# Patient Record
Sex: Male | Born: 1975 | Race: Black or African American | Hispanic: No | Marital: Single | Smoking: Current every day smoker
Health system: Southern US, Community
[De-identification: ages and names within clinical notes are randomized; demographics above are authoritative.]

## PROBLEM LIST (undated history)

## (undated) DIAGNOSIS — E785 Hyperlipidemia, unspecified: Secondary | ICD-10-CM

## (undated) DIAGNOSIS — F17200 Nicotine dependence, unspecified, uncomplicated: Secondary | ICD-10-CM

## (undated) DIAGNOSIS — E559 Vitamin D deficiency, unspecified: Secondary | ICD-10-CM

## (undated) DIAGNOSIS — T7840XA Allergy, unspecified, initial encounter: Secondary | ICD-10-CM

## (undated) DIAGNOSIS — G4730 Sleep apnea, unspecified: Secondary | ICD-10-CM

## (undated) DIAGNOSIS — G4733 Obstructive sleep apnea (adult) (pediatric): Secondary | ICD-10-CM

## (undated) DIAGNOSIS — J302 Other seasonal allergic rhinitis: Secondary | ICD-10-CM

## (undated) DIAGNOSIS — K219 Gastro-esophageal reflux disease without esophagitis: Secondary | ICD-10-CM

## (undated) HISTORY — DX: Nicotine dependence, unspecified, uncomplicated: F17.200

## (undated) HISTORY — DX: Obstructive sleep apnea (adult) (pediatric): G47.33

## (undated) HISTORY — DX: Vitamin D deficiency, unspecified: E55.9

## (undated) HISTORY — DX: Sleep apnea, unspecified: G47.30

## (undated) HISTORY — DX: Allergy, unspecified, initial encounter: T78.40XA

## (undated) HISTORY — DX: Hyperlipidemia, unspecified: E78.5

## (undated) HISTORY — DX: Other seasonal allergic rhinitis: J30.2

## (undated) HISTORY — DX: Gastro-esophageal reflux disease without esophagitis: K21.9

---

## 1997-08-18 ENCOUNTER — Encounter: Admission: RE | Admit: 1997-08-18 | Discharge: 1997-08-18 | Payer: Self-pay | Admitting: *Deleted

## 2007-07-19 ENCOUNTER — Encounter: Admission: RE | Admit: 2007-07-19 | Discharge: 2007-07-19 | Payer: Self-pay | Admitting: Internal Medicine

## 2011-07-14 ENCOUNTER — Encounter: Payer: Self-pay | Admitting: Gastroenterology

## 2011-07-30 ENCOUNTER — Encounter: Payer: Self-pay | Admitting: Gastroenterology

## 2011-07-30 ENCOUNTER — Ambulatory Visit (INDEPENDENT_AMBULATORY_CARE_PROVIDER_SITE_OTHER): Payer: BC Managed Care – PPO | Admitting: Gastroenterology

## 2011-07-30 VITALS — BP 110/70 | HR 72 | Ht 68.0 in | Wt 235.0 lb

## 2011-07-30 DIAGNOSIS — R198 Other specified symptoms and signs involving the digestive system and abdomen: Secondary | ICD-10-CM

## 2011-07-30 DIAGNOSIS — R12 Heartburn: Secondary | ICD-10-CM

## 2011-07-30 DIAGNOSIS — K219 Gastro-esophageal reflux disease without esophagitis: Secondary | ICD-10-CM

## 2011-07-30 DIAGNOSIS — R1310 Dysphagia, unspecified: Secondary | ICD-10-CM

## 2011-07-30 DIAGNOSIS — R194 Change in bowel habit: Secondary | ICD-10-CM

## 2011-07-30 MED ORDER — MOVIPREP 100 G PO SOLR: 1.0000 | ORAL | Status: DC

## 2011-07-30 NOTE — Progress Notes (Signed)
HPI: This is a   very pleasant 36 year old man whom I am meeting for the first time today.  Difficulty swallowing intermittently.  Drinking liquids can have feeling of going down wrong tube, odynophagia.  This occurs 2-3 times a month.  Also has intermittent "intense hunger pains." periumbilically.  Lasts 30 min. +pyrosis occasionally with tomatos, apples, certain fried foods.  Had low pelvic pain, noticed change in bowels (harder to get out, a lot strain a lot).  No overt GI bleeding.  Currently on ppi, for 2 years, takes prn about once a week. No other antiacids.  Nonsmoker, rare etoh, rare caffeine.    Overall his weight is pretty stable.     Review of systems: Pertinent positive and negative review of systems were noted in the above HPI section. Complete review of systems was performed and was otherwise normal.    Past Medical History  Diagnosis Date  . GERD (gastroesophageal reflux disease)     History reviewed. No pertinent past surgical history.  Current Outpatient Prescriptions  Medication Sig Dispense Refill  . omeprazole (PRILOSEC) 40 MG capsule Take 40 mg by mouth as needed.        Allergies as of 07/30/2011  . (No Known Allergies)    Family History  Problem Relation Age of Onset  . Colon cancer Neg Hx   . Colon polyps Father     History   Social History  . Marital Status: Single    Spouse Name: N/A    Number of Children: N/A  . Years of Education: N/A   Occupational History  . Not on file.   Social History Main Topics  . Smoking status: Current Everyday Smoker  . Smokeless tobacco: Not on file   Comment: form given 07-30-11  . Alcohol Use: Yes     occasional  . Drug Use: No  . Sexually Active: Not on file   Other Topics Concern  . Not on file   Social History Narrative  . No narrative on file       Physical Exam: BP 110/70  Pulse 72  Ht 5\' 8"  (1.727 m)  Wt 235 lb (106.595 kg)  BMI 35.73 kg/m2 Constitutional: generally  well-appearing Psychiatric: alert and oriented x3 Eyes: extraocular movements intact Mouth: oral pharynx moist, no lesions Neck: supple no lymphadenopathy Cardiovascular: heart regular rate and rhythm Lungs: clear to auscultation bilaterally Abdomen: soft, nontender, nondistended, no obvious ascites, no peritoneal signs, normal bowel sounds Extremities: no lower extremity edema bilaterally Skin: no lesions on visible extremities    Assessment and plan: 36 y.o. male with  GERD-like dyspepsia, recent change in bowel habits, intermittent mild dysphasia.  I recommended he change the way he is taking his proton pump inhibitor so that he takes it every day instead of once a week or so. He will also try fiber supplements for his recent change in bowel habits. I think we should proceed with EGD as well as colonoscopy at his soonest convenience to check for infections, gastritis, peptic ulcer disease, significant colon pathology that could have led to his change in bowel habits.

## 2011-07-30 NOTE — Patient Instructions (Addendum)
One of your biggest health concerns is your smoking.  This increases your risk for most cancers and serious cardiovascular diseases such as strokes, heart attacks.  You should try your best to stop.  If you need assistance, please contact your PCP or Smoking Cessation Class at [facility] [phone]) or [facility]uit[facility] (1-800-QUIT-NOW). You will be set up for a colonoscopy for change in bowel habits. You will be set up for an upper endoscopy for GERD, pyrosis, dysphagia. You should change the way you are taking your antiacid medicine (omeprazole) so that you are taking it 20-30 minutes prior to a decent meal as that is the way the pill is designed to work most effectively. Please start taking citrucel (orange flavored) powder fiber supplement.  This may cause some bloating at first but that usually goes away. Begin with a small spoonful and work your way up to a large, heaping spoonful daily over a week.

## 2011-08-15 ENCOUNTER — Ambulatory Visit (AMBULATORY_SURGERY_CENTER): Payer: BC Managed Care – PPO | Admitting: Gastroenterology

## 2011-08-15 ENCOUNTER — Encounter: Payer: Self-pay | Admitting: Gastroenterology

## 2011-08-15 VITALS — BP 131/77 | HR 76 | Temp 98.5°F | Resp 20 | Ht 68.0 in | Wt 235.0 lb

## 2011-08-15 DIAGNOSIS — R198 Other specified symptoms and signs involving the digestive system and abdomen: Secondary | ICD-10-CM

## 2011-08-15 DIAGNOSIS — K219 Gastro-esophageal reflux disease without esophagitis: Secondary | ICD-10-CM

## 2011-08-15 DIAGNOSIS — R1310 Dysphagia, unspecified: Secondary | ICD-10-CM

## 2011-08-15 DIAGNOSIS — R194 Change in bowel habit: Secondary | ICD-10-CM

## 2011-08-15 DIAGNOSIS — K2970 Gastritis, unspecified, without bleeding: Secondary | ICD-10-CM

## 2011-08-15 DIAGNOSIS — D131 Benign neoplasm of stomach: Secondary | ICD-10-CM

## 2011-08-15 DIAGNOSIS — R12 Heartburn: Secondary | ICD-10-CM

## 2011-08-15 MED ORDER — SODIUM CHLORIDE 0.9 % IV SOLN: 500.0000 mL | INTRAVENOUS | Status: DC

## 2011-08-15 NOTE — Patient Instructions (Addendum)
One of your biggest health concerns is your smoking.  This increases your risk for most cancers and serious cardiovascular diseases such as strokes, heart attacks.  You should try your best to stop.  If you need assistance, please contact your PCP or Smoking Cessation Class at [facility] [phone]) or [place] (1-800-QUIT-NOW).   YOU HAD AN ENDOSCOPIC PROCEDURE TODAY AT [facility]: Refer to the procedure report that was given to you for any specific questions about what was found during the examination.  If the procedure report does not answer your questions, please call your gastroenterologist to clarify.  If you requested that your care partner not be given the details of your procedure findings, then the procedure report has been included in a sealed envelope for you to review at your convenience later.  YOU SHOULD EXPECT: Some feelings of bloating in the abdomen. Passage of more gas than usual.  Walking can help get rid of the air that was put into your GI tract during the procedure and reduce the bloating. If you had a lower endoscopy (such as a colonoscopy or flexible sigmoidoscopy) you may notice spotting of blood in your stool or on the toilet paper. If you underwent a bowel prep for your procedure, then you may not have a normal bowel movement for a few days.  DIET: Your first meal following the procedure should be a light meal and then it is ok to progress to your normal diet.  A half-sandwich or bowl of soup is an example of a good first meal.  Heavy or fried foods are harder to digest and may make you feel nauseous or bloated.  Likewise meals heavy in dairy and vegetables can cause extra gas to form and this can also increase the bloating.  Drink plenty of fluids but you should avoid alcoholic beverages for 24 hours.  ACTIVITY: Your care partner should take you home directly after the procedure.  You should plan to take it easy, moving slowly for the rest of  the day.  You can resume normal activity the day after the procedure however you should NOT DRIVE or use heavy machinery for 24 hours (because of the sedation medicines used during the test).    SYMPTOMS TO REPORT IMMEDIATELY: A gastroenterologist can be reached at any hour.  During normal business hours, 8:30 AM to 5:00 PM Monday through Friday, call [phone].  After hours and on weekends, please call the GI answering service at [phone] who will take a message and have the physician on call contact you.   Following lower endoscopy (colonoscopy or flexible sigmoidoscopy):  Excessive amounts of blood in the stool  Significant tenderness or worsening of abdominal pains  Swelling of the abdomen that is new, acute  Fever of 100F or higher  Following upper endoscopy (EGD)  Vomiting of blood or coffee ground material  New chest pain or pain under the shoulder blades  Painful or persistently difficult swallowing  New shortness of breath  Fever of 100F or higher  Black, tarry-looking stools  FOLLOW UP: If any biopsies were taken you will be contacted by phone or by letter within the next 1-3 weeks.  Call your gastroenterologist if you have not heard about the biopsies in 3 weeks.  Our staff will call the home number listed on your records the next business day following your procedure to check on you and address any questions or concerns that you may have at that time regarding  the information given to you following your procedure. This is a courtesy call and so if there is no answer at the home number and we have not heard from you through the emergency physician on call, we will assume that you have returned to your regular daily activities without incident.  SIGNATURES/CONFIDENTIALITY: You and/or your care partner have signed paperwork which will be entered into your electronic medical record.  These signatures attest to the fact that that the information above on your After Visit  Summary has been reviewed and is understood.  Full responsibility of the confidentiality of this discharge information lies with you and/or your care-partner.   Take your acid medicine 20-30 minutes prior to breakfast  Start once daily fiber supplements- as recommended in the office per Dr. Christella Hartigan

## 2011-08-15 NOTE — Op Note (Signed)
[facility] [address] [place], [place]  [address]  ENDOSCOPY PROCEDURE REPORT  PATIENT:  Bruce Brooks, Bruce Brooks  MR#:  [id number] BIRTHDATE:  Aug 13, 1975, 36 yrs. old  GENDER:  male ENDOSCOPIST:  Rachael Fee, MD PROCEDURE DATE:  08/15/2011 PROCEDURE:  EGD with biopsy, 19147 ASA CLASS:  Class II INDICATIONS:  GERD, dyspepsia MEDICATIONS:  There was residual sedation effect present from prior procedure. TOPICAL ANESTHETIC:  none  DESCRIPTION OF PROCEDURE:   After the risks benefits and alternatives of the procedure were thoroughly explained, informed consent was obtained.  The LB GIF-H180 [id number] endoscope was introduced through the mouth and advanced to the second portion of the duodenum, without limitations.  The instrument was slowly withdrawn as the mucosa was fully examined. <<PROCEDUREIMAGES>> There was mild, non-specific gastritis. Biopsies taken and sent to pathology (jar 1) (see image1 and image2).    Retroflexed views revealed no abnormalities.    The scope was then withdrawn from the patient and the procedure completed. COMPLICATIONS:  None  ENDOSCOPIC IMPRESSION: 1) Mild gastritis, biopsied to check for H. pylori  RECOMMENDATIONS: Change your anti acid medicine so that you are taking it 20-30 min prior to breakfast meal (as discussed in office).  ______________________________ Rachael Fee, MD  cc: Kellie Shropshire, MD  n. eSIGNED:   Rachael Fee at 08/15/2011 03:58 PM  Ashok Norris, [phone]

## 2011-08-15 NOTE — Op Note (Signed)
[facility] [address] [place], [place]  [address]  COLONOSCOPY PROCEDURE REPORT  PATIENT:  Bruce, Brooks  MR#:  [id number] BIRTHDATE:  June 29, 1975, 36 yrs. old  GENDER:  male ENDOSCOPIST:  Rachael Fee, MD REF. BY:  Kellie Shropshire, M.D. PROCEDURE DATE:  08/15/2011 PROCEDURE:  Colonoscopy 19147 ASA CLASS:  Class II INDICATIONS:  change in bowel habits MEDICATIONS:   Fentanyl 175 mcg IV, These medications were titrated to patient response per physician's verbal order, Versed 12 mg IV  DESCRIPTION OF PROCEDURE:   After the risks benefits and alternatives of the procedure were thoroughly explained, informed consent was obtained.  Digital rectal exam was performed and revealed no rectal masses.   The LB [id number]-H180AL [id number] endoscope was introduced through the anus and advanced to the cecum, which was identified by both the appendix and ileocecal valve, without limitations.  The quality of the prep was good..  The instrument was then slowly withdrawn as the colon was fully examined. <<PROCEDUREIMAGES>> FINDINGS:  A normal appearing cecum, ileocecal valve, and appendiceal orifice were identified. The ascending, hepatic flexure, transverse, splenic flexure, descending, sigmoid colon, and rectum appeared unremarkable (see image1, image2, and image3). Retroflexed views in the rectum revealed no abnormalities. COMPLICATIONS:  None  ENDOSCOPIC IMPRESSION: 1) Normal colon 2) No polyps or cancers  RECOMMENDATIONS: Start once daily fiber supplements, as recommended in the office.  ______________________________ Rachael Fee, MD  n. eSIGNED:   Rachael Fee at 08/15/2011 03:49 PM  Ashok Norris, [phone]

## 2011-08-15 NOTE — Progress Notes (Signed)
Patient did not experience any of the following events: a burn prior to discharge; a fall within the facility; wrong site/side/patient/procedure/implant event; or a hospital transfer or hospital admission upon discharge from the facility. (G8907) Patient did not have preoperative order for IV antibiotic SSI prophylaxis. (G8918)  

## 2011-08-18 ENCOUNTER — Telehealth: Payer: Self-pay

## 2011-08-18 NOTE — Telephone Encounter (Signed)
Left message on answering machine. 

## 2011-08-20 ENCOUNTER — Encounter: Payer: Self-pay | Admitting: Gastroenterology

## 2015-08-28 ENCOUNTER — Institutional Professional Consult (permissible substitution): Payer: BC Managed Care – PPO | Admitting: Neurology

## 2016-12-28 ENCOUNTER — Encounter: Payer: Self-pay | Admitting: Neurology

## 2016-12-29 ENCOUNTER — Encounter: Payer: Self-pay | Admitting: Neurology

## 2016-12-29 ENCOUNTER — Ambulatory Visit (INDEPENDENT_AMBULATORY_CARE_PROVIDER_SITE_OTHER): Payer: BC Managed Care – PPO | Admitting: Neurology

## 2016-12-29 ENCOUNTER — Encounter (INDEPENDENT_AMBULATORY_CARE_PROVIDER_SITE_OTHER): Payer: Self-pay

## 2016-12-29 VITALS — BP 120/76 | HR 58 | Ht 68.0 in | Wt 242.0 lb

## 2016-12-29 DIAGNOSIS — R351 Nocturia: Secondary | ICD-10-CM | POA: Diagnosis not present

## 2016-12-29 DIAGNOSIS — F17200 Nicotine dependence, unspecified, uncomplicated: Secondary | ICD-10-CM

## 2016-12-29 DIAGNOSIS — R0683 Snoring: Secondary | ICD-10-CM

## 2016-12-29 DIAGNOSIS — E669 Obesity, unspecified: Secondary | ICD-10-CM | POA: Diagnosis not present

## 2016-12-29 DIAGNOSIS — G4719 Other hypersomnia: Secondary | ICD-10-CM | POA: Diagnosis not present

## 2016-12-29 NOTE — Progress Notes (Signed)
SLEEP MEDICINE CLINIC   Provider:  Larey Seat, M D  Primary Care Physician:  Glendale Chard, MD   Referring Provider: [name], FNP    Chief Complaint  Patient presents with  . New Patient (Initial Visit)    pt alone, rm 10. pt  is waking up several times at night. pt has been told he snores and stops breathing in sleep.     HPI:  Bruce Brooks is a 41 y.o. male , seen here as in a referral  from Dr. Baird Cancer and NP [name] for a sleep apnea evaluation, after being told he snores and stops to breath at night in his sleep. Chief complaint according to patient : hypersomnia/ fatigue.  Bruce Brooks is a 41 year old African-American right-handed male patient, presenting with a feeling of increased sleepiness and fatigue in daytime, which  has slowly evolved over several years. He has changed his lifestyle in several ways, has adjusted his diet and has implemented a more active lifestyle. He is in the process of reducing his tobacco use and endorsed now 6 cigarettes a day on average. He very rarely drinks alcohol, he drinks coffee during daytime mostly in the morning about 16 ounces daily.  Sleep habits are as follows: He goes to bed between 10-11 PM, after a late dinner at 9.30 PM. The patient usually sleeps on his side, or falls asleep on her side with 2 pillows for head support. He shares the bedroom with a male partner. He describes his bedroom as cool, quiet and dark. He has been told that he snores but he has never woken himself up from snoring. He has 2-3 time nocturia( 2.00- 4.00- finally at 6.30 AM).  Rises between 6.30 and 7 AM. He uses an alarm for backup, but is usually spontaneously awake at the time he needs to rise. He does not feel as refreshed and restored as he used to. He denies having a dry mouth, palpitations, nausea, dizziness or headaches/ pain. He endorsed joint stiffness. He estimates ( through fit bit ) 4-6 hours of sleep each night.   Sleep  medical history and family sleep history: mother has OSA, sister has been snoring.   Social history: He works 2 jobs , between 8 Am and 9.00 PM. No night shifts. No children, not married. College graduate . He has changed his lifestyle in several ways, has adjusted his diet and has implemented a more active lifestyle. He is in the process of reducing his tobacco use and endorsed now 6 cigarettes a day on average. He very rarely drinks alcohol, he drinks coffee during daytime mostly in the morning about 16 ounces daily.   Review of Systems: Out of a complete 14 system review, the patient complains of only the following symptoms, and all other reviewed systems are negative. Snoring , fatigue, Sleepiness, occasionally sleep talking.  Epworth score  13/ 24  , Fatigue severity score 10  , depression score n/a    Social History   Social History  . Marital status: Single    Spouse name: N/A  . Number of children: N/A  . Years of education: N/A   Occupational History  . Not on file.   Social History Main Topics  . Smoking status: Current Every Day Smoker    Packs/day: 0.50  . Smokeless tobacco: Never Used     Comment: form given 07-30-11  . Alcohol use Yes     Comment: occasional  . Drug use: No  .  Sexual activity: Not on file   Other Topics Concern  . Not on file   Social History Narrative  . No narrative on file    Family History  Problem Relation Age of Onset  . Colon cancer Neg Hx   . Colon polyps Father     Past Medical History:  Diagnosis Date  . Allergy    seasonal  . GERD (gastroesophageal reflux disease)   . Hyperlipidemia   . Tobacco dependence   . Vitamin D deficiency     No past surgical history on file.  Current Outpatient Prescriptions  Medication Sig Dispense Refill  . ergocalciferol (VITAMIN D2) 50000 units capsule Take 50,000 Units by mouth once a week.     No current facility-administered medications for this visit.     Allergies as of  12/29/2016  . (No Known Allergies)    Vitals: BP 120/76   Pulse (!) 58   Ht 5\' 8"  (1.727 m)   Wt 242 lb (109.8 kg)   BMI 36.80 kg/m  Last Weight:  Wt Readings from Last 1 Encounters:  12/29/16 242 lb (109.8 kg)   KTG:YBWL mass index is 36.8 kg/m.     Last Height:   Ht Readings from Last 1 Encounters:  12/29/16 5\' 8"  (1.727 m)    Physical exam:  General: The patient is awake, alert and appears not in acute distress. The patient is well groomed. Head: Normocephalic, atraumatic. Neck is supple. Mallampati 5- uvula is invisible,,  neck circumference: 19.25 inches. Nasal airflow patent,  Retrognathia is seen.  Cardiovascular:  Regular rate and rhythm , without  murmurs or carotid bruit, and without distended neck veins. Respiratory: Lungs are clear to auscultation. Skin:  Without evidence of edema, or rash Trunk: BMI is over 35-. The patient's posture is erect , muscular build.  Neurologic exam : The patient is awake and alert, oriented to place and time.   Memory subjective  described as intact. Attention span & concentration ability appears normal.  Speech is fluent, without dysarthria, dysphonia or aphasia.  Mood and affect are appropriate.  Cranial nerves: Pupils are equal and briskly reactive to light. Extraocular movements  in vertical and horizontal planes intact and without nystagmus. Visual fields by finger perimetry are intact.Hearing to finger rub intact.  Facial sensation intact to fine touch. Facial motor strength is symmetric and tongue and uvula move midline. Shoulder shrug was symmetrical.  Motor exam:  Normal tone, muscle bulk and symmetric strength in all extremities. Sensory:  Fine touch, pinprick and vibration were tested in all extremities. Proprioception tested in the upper extremities was normal. Coordination: Rapid alternating movements in the fingers/hands was normal. Finger-to-nose maneuver  normal without evidence of ataxia, dysmetria or tremor. Gait and  station: Patient walks without assistive device -Turns with  3 Steps. Romberg testing is negative.  Deep tendon reflexes: in the  upper and lower extremities are symmetric and intact. Babinski maneuver response is downgoing.   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 Overall his risk factors are indirectly related to his weight, such as the above average neck circumference, but reported snoring, he  also has a high-grade Mallampati and mild retrognathia. His risk factors are also elevated because of African-American race and male gender. Hi hypersomnia and daytime fatigue are most likely due to mild sleep apnea.   The patient was advised of the nature of the diagnosed disorder , the treatment options and the  risks for general health and wellness arising from not treating the condition.   I spent more than 50 minutes of face to face time with the patient.  Greater than 50% of time was spent in counseling and coordination of care. We have discussed the diagnosis and differential and I answered the patient's questions.    Plan:  Treatment plan and additional workup : SPLIT night PSG , attended sleep study   Rv with me .  Larey Seat, MD 46/80/3212, 24:82 AM  Certified in Neurology by ABPN Certified in [place] by [facility] [address] [place], [place] [address]

## 2016-12-29 NOTE — Patient Instructions (Signed)

## 2017-02-03 ENCOUNTER — Ambulatory Visit (INDEPENDENT_AMBULATORY_CARE_PROVIDER_SITE_OTHER): Payer: BC Managed Care – PPO | Admitting: Neurology

## 2017-02-03 DIAGNOSIS — R351 Nocturia: Secondary | ICD-10-CM

## 2017-02-03 DIAGNOSIS — G4719 Other hypersomnia: Secondary | ICD-10-CM

## 2017-02-03 DIAGNOSIS — G4710 Hypersomnia, unspecified: Secondary | ICD-10-CM

## 2017-02-03 DIAGNOSIS — E669 Obesity, unspecified: Secondary | ICD-10-CM

## 2017-02-03 DIAGNOSIS — R0683 Snoring: Secondary | ICD-10-CM

## 2017-02-03 DIAGNOSIS — F17200 Nicotine dependence, unspecified, uncomplicated: Secondary | ICD-10-CM

## 2017-02-09 ENCOUNTER — Other Ambulatory Visit: Payer: Self-pay | Admitting: Neurology

## 2017-02-09 DIAGNOSIS — F17200 Nicotine dependence, unspecified, uncomplicated: Secondary | ICD-10-CM | POA: Insufficient documentation

## 2017-02-09 DIAGNOSIS — G4719 Other hypersomnia: Secondary | ICD-10-CM

## 2017-02-09 DIAGNOSIS — E669 Obesity, unspecified: Secondary | ICD-10-CM | POA: Insufficient documentation

## 2017-02-09 DIAGNOSIS — G4733 Obstructive sleep apnea (adult) (pediatric): Secondary | ICD-10-CM

## 2017-02-09 DIAGNOSIS — R0683 Snoring: Secondary | ICD-10-CM | POA: Insufficient documentation

## 2017-02-09 DIAGNOSIS — R351 Nocturia: Secondary | ICD-10-CM | POA: Insufficient documentation

## 2017-02-09 NOTE — Procedures (Signed)
PATIENT'S NAME:  Bruce Brooks, Bruce Brooks DOB:      [birth year]      MR#:    [id number]     DATE OF RECORDING: 02/03/2017 REFERRING M.D.:  [name], FNP Study Performed:   Baseline Polysomnogram HISTORY:  [name] is a 41 year old African-American, right-handed male patient, presenting with a feeling of increased sleepiness and fatigue in daytime, which has slowly evolved over several years. He has changed his lifestyle in several ways, has adjusted his diet and has implemented a more active lifestyle. He is in the process of reducing his tobacco use and endorsed now 6 cigarettes a day on average. He very rarely drinks alcohol, he drinks coffee during daytime mostly in the morning about 16 ounces daily. The patient endorsed the Epworth Sleepiness Scale at 13/24 points.   The patient's weight 242 pounds with a height of 68 (inches), resulting in a BMI of 36.8 kg/m2. The patient's neck circumference measured 19.2 inches.  CURRENT MEDICATIONS: Ergocalciferol   PROCEDURE:  This is a multichannel digital polysomnogram utilizing the Somnostar 11.2 system.  Electrodes and sensors were applied and monitored per AASM Specifications.   EEG, EOG, Chin and Limb EMG, were sampled at 200 Hz.  ECG, Snore and Nasal Pressure, Thermal Airflow, Respiratory Effort, CPAP Flow and Pressure, Oximetry was sampled at 50 Hz. Digital video and audio were recorded.      BASELINE STUDY- Lights Out was at 22:39 and Lights On at 05:24.  Total recording time (TRT) was 406 minutes, with a total sleep time (TST) of 265.5 minutes. The patient's sleep latency was 17.5 minutes.  REM latency was 108.5 minutes.  The sleep efficiency was 65.4 %.     SLEEP ARCHITECTURE: WASO (Wake after sleep onset) was 122.5 minutes.  There were 41.5 minutes in Stage N1, 189 minutes Stage N2, 0 minutes Stage N3 and 35 minutes in Stage REM.  The percentage of Stage N1 was 15.6%, Stage N2 was 71.2%, Stage N3 was 0% and Stage R (REM sleep) was 13.2%.   RESPIRATORY ANALYSIS:  There were a total of 66 respiratory events:  5 obstructive apneas, 61 hypopneas with 0 respiratory event related arousals (RERAs). The total APNEA/HYPOPNEA INDEX (AHI) was 14.9/hour and the total RESPIRATORY DISTURBANCE INDEX was 14.9 /hour.  8 events occurred in REM sleep and 106 events in NREM.  The REM AHI was 13.7 /hour, versus a non-REM AHI of 15.1. The patient spent 57 minutes of total sleep time in the supine position and 209 minutes in non-supine. The supine AHI was 12.6 versus a non-supine AHI of 15.6.  OXYGEN SATURATION & C02:  The Wake baseline 02 saturation was 96%, with the lowest being 89%. Time spent below 89% saturation equaled 0 minutes.   PERIODIC LIMB MOVEMENTS:  The patient had a total of 0 Periodic Limb Movements.  The arousals were noted as: 64 were spontaneous, 0 were associated with PLMs, and 66 were associated with respiratory events. Audio and video analysis did not show any abnormal or unusual movements, behaviors, phonations or vocalizations.  The patient snored loudly and had a very fragmented sleep pattern, nocturia times 2.   EKG was in keeping with normal sinus rhythm (NSR).Post-study, the patient indicated that sleep was worse than usual. A SPLIT protocol was not initiated due to low AHI at the cut -off point at 2 AM.    IMPRESSION:  1. Obstructive Sleep Apnea (OSA) with an AHI of 14.9, without significant exacerbation in supine or REM sleep.  2.  Loud Primary Snoring 3. Fragmentation of Sleep  RECOMMENDATIONS:  1. CPAP titration study to optimize therapy or auto CPAP.  2. This patient could consider a dental device as well, as hypoxemia and REM sleep dependent apnea were not present.   3. Further information regarding OSA may be obtained from [employer] (www.sleepfoundation.org) or American Sleep Apnea Association (www.sleepapnea.org). 4. Consider dedicated sleep psychology referral if insomnia is of clinical concern.    5. A follow up appointment will be scheduled in the Sleep Clinic at [facility]. The referring provider will be notified of the results.      I certify that I have reviewed the entire raw data recording prior to the issuance of this report in accordance with the Standards of Accreditation of the American Academy of Sleep Medicine (AASM)     Larey Seat, MD       02-09-2017 Diplomat, American Board of Psychiatry and Neurology  Diplomat, American Board of [place] Director, [employer] [facility] [employer]

## 2017-02-10 ENCOUNTER — Telehealth: Payer: Self-pay | Admitting: Neurology

## 2017-02-10 NOTE — Telephone Encounter (Signed)
-----   Message from Larey Seat, MD sent at 02/09/2017  5:30 PM EST ----- Mild apnea, Recommend to start CPAP auto-titration before 03-16-2017 , DME to fit interface - possibly nasal mask for patient, CPAP auto between 5 and 15 cm water, 3 cm EPR and heated humidity.  If CPAP does not work for this patient, consider a dental device. CD   Cc Glendale Chard, MD and [name] NP

## 2017-02-10 NOTE — Telephone Encounter (Signed)
Called patient to discuss sleep study results. No answer at this time. LVM for the patient to call back.   

## 2017-02-12 ENCOUNTER — Telehealth: Payer: Self-pay | Admitting: Neurology

## 2017-02-12 NOTE — Telephone Encounter (Signed)
I called pt. I advised pt that Dr. Brett Fairy reviewed their sleep study results and found that pt has sleep apnea. Dr. Brett Fairy recommends that pt start CPAP. I reviewed PAP compliance expectations with the pt. Pt is agreeable to starting a CPAP. I advised pt that an order will be sent to a DME, [facility], and [facility] will call the pt within about one week after they file with the pt's insurance. [facility] will show the pt how to use the machine, fit for masks, and troubleshoot the CPAP if needed. A follow up appt was made for insurance purposes with Cecille Rubin, NP on Feb 18,2019 at 9:45 am. Pt verbalized understanding to arrive 15 minutes early and bring their CPAP. A letter with all of this information in it will be mailed to the pt as a reminder. I verified with the pt that the address we have on file is correct. Pt verbalized understanding of results. Pt had no questions at this time but was encouraged to call back if questions arise.

## 2017-03-18 ENCOUNTER — Telehealth: Payer: Self-pay | Admitting: Neurology

## 2017-03-18 NOTE — Telephone Encounter (Signed)
Received this message regarding the pt starting the CPAP from [facility]  "I wanted to let you know that this patient has not started Cpap with [place]. I have Left Voicemails 02/12/2017, 02/17/2017, 03/05/2017 and today. So far we have not received a call back. I am Voiding the sales order for now but it can be recreated if he does reach out to [place]. Let me know if you hear from him please. "

## 2017-03-24 ENCOUNTER — Ambulatory Visit: Payer: BC Managed Care – PPO | Admitting: Neurology

## 2017-03-24 ENCOUNTER — Encounter (INDEPENDENT_AMBULATORY_CARE_PROVIDER_SITE_OTHER): Payer: Self-pay

## 2017-03-24 ENCOUNTER — Ambulatory Visit (INDEPENDENT_AMBULATORY_CARE_PROVIDER_SITE_OTHER): Payer: BC Managed Care – PPO | Admitting: Neurology

## 2017-03-24 ENCOUNTER — Encounter: Payer: Self-pay | Admitting: Neurology

## 2017-03-24 DIAGNOSIS — R202 Paresthesia of skin: Secondary | ICD-10-CM

## 2017-03-24 NOTE — Progress Notes (Signed)
Please refer to EMG and nerve conduction study procedure note. 

## 2017-03-24 NOTE — Procedures (Signed)
HISTORY:  Bruce Brooks is a 42 year old patient with a several year history of paresthesias in the left anterolateral thigh, the patient does report some intermittent back pain.  He also has had some tingling in the hands bilaterally that will come and go, he denies any neck pain or pain down the arms.  The patient is being evaluated for the above symptoms.  NERVE CONDUCTION STUDIES:  Nerve conduction studies were performed on both upper extremities.  The distal motor latencies for the median nerves were prolonged on the right, normal on the left with normal motor amplitudes for these nerves bilaterally.  The distal motor latencies and motor amplitudes for the ulnar nerves are normal bilaterally.  Nerve conduction velocities for the median and ulnar nerves are normal bilaterally.  Sensory latencies for the median nerves were prolonged on the right, normal on the left, and normal for the ulnar nerves bilaterally.  The F-wave latencies for the median and ulnar nerves were normal bilaterally.  Nerve conduction studies were performed on the left lower extremity. The distal motor latencies and motor amplitudes for the peroneal and posterior tibial nerves were within normal limits. The nerve conduction velocities for these nerves were also normal. The sensory latencies for the peroneal and saphenous nerves were within normal limits.   EMG STUDIES:  EMG study was performed on the left upper extremity:  The first dorsal interosseous muscle reveals 2 to 4 K units with full recruitment. No fibrillations or positive waves were noted. The abductor pollicis brevis muscle reveals 2 to 4 K units with full recruitment. No fibrillations or positive waves were noted. The extensor indicis proprius muscle reveals 1 to 3 K units with full recruitment. No fibrillations or positive waves were noted. The pronator teres muscle reveals 2 to 3 K units with full recruitment. No fibrillations or positive waves  were noted. The biceps muscle reveals 1 to 2 K units with full recruitment. No fibrillations or positive waves were noted. The triceps muscle reveals 2 to 4 K units with full recruitment. No fibrillations or positive waves were noted. The anterior deltoid muscle reveals 2 to 3 K units with full recruitment. No fibrillations or positive waves were noted. The cervical paraspinal muscles were tested at 2 levels. No abnormalities of insertional activity were seen at the upper level tested.  1+ positive waves were seen in the lower level.  There was good relaxation.  EMG study was performed on the left lower extremity:  The tibialis anterior muscle reveals 2 to 4K motor units with full recruitment. No fibrillations or positive waves were seen. The peroneus tertius muscle reveals 2 to 4K motor units with full recruitment. No fibrillations or positive waves were seen. The medial gastrocnemius muscle reveals 1 to 3K motor units with full recruitment. No fibrillations or positive waves were seen. The vastus lateralis muscle reveals 2 to 4K motor units with full recruitment. No fibrillations or positive waves were seen. The iliopsoas muscle reveals 2 to 4K motor units with full recruitment. No fibrillations or positive waves were seen. The biceps femoris muscle (long head) reveals 2 to 4K motor units with full recruitment. No fibrillations or positive waves were seen. The lumbosacral paraspinal muscles were tested at 3 levels, and revealed no abnormalities of insertional activity at all 3 levels tested. There was good relaxation.   IMPRESSION:  Nerve conduction studies done on both upper extremities and on the left lower extremity does not show evidence of a generalized peripheral neuropathy.  There is evidence of a mild right carpal tunnel syndrome, this is not present on the left.  EMG evaluation of the left lower extremity was unremarkable, no evidence of a lumbosacral radiculopathy was seen.  EMG of the  left upper extremity was unremarkable, but evaluation of the cervical paraspinal muscles showed isolated mild denervation in the lower level.  In isolation, the clinical significance of this is not clear, but it could potentially represent a very low-grade cervical radiculopathy of indeterminate level, clinical correlation is required.  Jill Alexanders MD 03/24/2017 1:52 PM  [facility] [address] [place], [place] [address]  Phone [phone] Fax [phone]

## 2017-03-30 NOTE — Progress Notes (Signed)
[place]    Nerve / Sites Muscle Latency Ref. Amplitude Ref. Rel Amp Segments Distance Velocity Ref. Area    ms ms mV mV %  cm m/s m/s mVms  L Median - APB     Wrist APB 3.5 ?4.4 7.0 ?4.0 100 Wrist - APB 7   21.5     Upper arm APB 7.6  6.5  92.1 Upper arm - Wrist 25 61 ?49 20.0  R Median - APB     Wrist APB 4.3 ?4.4 9.5 ?4.0 100 Wrist - APB 7   23.0     Upper arm APB 8.5  8.5  89.8 Upper arm - Wrist 24 56 ?49 22.3  L Ulnar - ADM     Wrist ADM 2.4 ?3.3 13.8 ?6.0 100 Wrist - ADM 7   41.3     B.Elbow ADM 6.6  13.4  96.7 B.Elbow - Wrist 22 53 ?49 38.2     A.Elbow ADM 8.2  12.2  91.2 A.Elbow - B.Elbow 10 62 ?49 34.7         A.Elbow - Wrist      R Ulnar - ADM     Wrist ADM 2.1 ?3.3 11.6 ?6.0 100 Wrist - ADM 7   38.6     B.Elbow ADM 6.1  11.1  95.6 B.Elbow - Wrist 21 52 ?49 35.0     A.Elbow ADM 7.8  10.5  94.9 A.Elbow - B.Elbow 10 62 ?49 34.8         A.Elbow - Wrist      L Peroneal - EDB     Ankle EDB 3.5 ?6.5 8.2 ?2.0 100 Ankle - EDB 9   19.9     Fib head EDB 10.3  7.6  93.3 Fib head - Ankle 37 55 ?44 18.7     Pop fossa EDB 11.9  7.3  94.9 Pop fossa - Fib head 10 62 ?44 18.1         Pop fossa - Ankle      L Tibial - AH     Ankle AH 3.6 ?5.8 17.6 ?4.0 100 Ankle - AH 9   41.9     Pop fossa AH 12.6  13.0  74.1 Pop fossa - Ankle 45 51 ?41 37.1                 SNC    Nerve / Sites Rec. Site Peak Lat Amp Segments Distance    ms V  cm  L Median - Orthodromic (Dig II, Mid palm)     Dig II Wrist 3.3 20 Dig II - Wrist 13  R Median - Orthodromic (Dig II, Mid palm)     Dig II Wrist 4.3 16 Dig II - Wrist 13  L Ulnar - Orthodromic, (Dig V, Mid palm)     Dig V Wrist 2.8 17 Dig V - Wrist 11  R Ulnar - Orthodromic, (Dig V, Mid palm)     Dig V Wrist 2.9 20 Dig V - Wrist 11             SNC    Nerve / Sites Rec. Site Peak Lat Ref.  Amp Ref. Segments Distance    ms ms V V  cm  L Superficial peroneal - Ankle     Lat leg Ankle 3.1 ?4.4 10 ?6 Lat leg - Ankle 14  L Saphenous - Lower leg (Knee)       Knee Medial leg 2.9  17  Knee - Medial leg 14         F  Wave    Nerve F Lat Ref.   ms ms  L Median - APB 27.0 ?31.0  L Ulnar - ADM 26.4 ?32.0  R Median - APB 28.8 ?31.0  R Ulnar - ADM 27.9 ?32.0             EMG

## 2017-05-04 ENCOUNTER — Ambulatory Visit: Payer: Self-pay | Admitting: Nurse Practitioner

## 2017-06-16 ENCOUNTER — Ambulatory Visit: Payer: BC Managed Care – PPO | Admitting: Nurse Practitioner

## 2017-08-04 ENCOUNTER — Encounter: Payer: Self-pay | Admitting: Nurse Practitioner

## 2017-08-05 NOTE — Progress Notes (Signed)
[facility]  PATIENT: Bruce Brooks. DOB: 1976-02-19   REASON FOR VISIT:Follow-up for newly diagnosed obstructive sleep apnea with initial CPAP HISTORY [place] ILLNESS:UPDATE 5/23/2019[name], 42 year old male returns for follow-up with newly diagnosed obstructive sleep apnea here for initial CPAP compliance.  He claims he is not having any trouble with his machine he has had some allergy issues and due to the stuffiness has not always used it at night.  Compliance data dated 05/07/2017-08/04/2017 shows compliance greater than 4 hours for 26 days out of 90 at 29%.  Average usage 4 hours 10 minutes.  Set pressure 5 to 15 years.  EPR level 3 AHI 5.7.  ESS 7.  He returns for reevaluation 10/15/18CDFranklin C Brooks is a 42 y.o. male , seen here as in a referral  from Dr. Baird Cancer and NP [name] for a sleep apnea evaluation, after being told he snores and stops to breath at night in his sleep. Chief complaint according to patient : hypersomnia/ fatigue.  Bruce Brooks is a 42 year old African-American right-handed male patient, presenting with a feeling of increased sleepiness and fatigue in daytime, which  has slowly evolved over several years. He has changed his lifestyle in several ways, has adjusted his diet and has implemented a more active lifestyle. He is in the process of reducing his tobacco use and endorsed now 6 cigarettes a day on average. He very rarely drinks alcohol, he drinks coffee during daytime mostly in the morning about 16 ounces daily.  Sleep habits are as follows: He goes to bed between 10-11 PM, after a late dinner at 9.30 PM. The patient usually sleeps on his side, or falls asleep on her side with 2 pillows for head support. He shares the bedroom with a male partner. He describes his bedroom as cool, quiet and dark. He has been told that he snores but he has never woken himself up from snoring. He has 2-3  time nocturia( 2.00- 4.00- finally at 6.30 AM).  Rises between 6.30 and 7 AM. He uses an alarm for backup, but is usually spontaneously awake at the time he needs to rise. He does not feel as refreshed and restored as he used to. He denies having a dry mouth, palpitations, nausea, dizziness or headaches/ pain. He endorsed joint stiffness. He estimates ( through fit bit ) 4-6 hours of sleep each night   REVIEW OF SYSTEMS: Full 14 system review of systems performed and notable only for those listed, all others are neg:  Constitutional: neg  Cardiovascular: neg Ear/Nose/Throat: neg  Skin: neg Eyes: neg Respiratory: neg Gastroitestinal: neg  Hematology/Lymphatic: neg  Endocrine: neg Musculoskeletal:neg Allergy/Immunology: neg Neurological: neg Psychiatric: neg Sleep : Obstructive sleep apnea with CPAP   ALLERGIES: No Known Allergies  HOME MEDICATIONS: Outpatient Medications Prior to Visit  Medication Sig Dispense Refill  . ergocalciferol (VITAMIN D2) 50000 units capsule Take 50,000 Units by mouth once a week.     No facility-administered medications prior to visit.     PAST MEDICAL HISTORY: Past Medical History:  Diagnosis Date  . Allergy    seasonal  . GERD (gastroesophageal reflux disease)   . Hyperlipidemia   . OSA (obstructive sleep apnea)   . Tobacco dependence   . Vitamin D deficiency     PAST SURGICAL HISTORY: History reviewed. No pertinent surgical history.  FAMILY HISTORY: Family History  Problem Relation Age of Onset  . Colon polyps Father   .  Colon cancer Neg Hx     SOCIAL HISTORY: Social History   Socioeconomic History  . Marital status: Single    Spouse name: Not on file  . Number of children: Not on file  . Years of education: Not on file  . Highest education level: Not on file  Occupational History  . Not on file  Social Needs  . Financial resource strain: Not on file  . Food insecurity:    Worry: Not on file    Inability: Not on file  .  Transportation needs:    Medical: Not on file    Non-medical: Not on file  Tobacco Use  . Smoking status: Current Every Day Smoker    Packs/day: 0.50  . Smokeless tobacco: Never Used  . Tobacco comment: form given 07-30-11  Substance and Sexual Activity  . Alcohol use: Yes    Comment: occasional  . Drug use: No  . Sexual activity: Not on file  Lifestyle  . Physical activity:    Days per week: Not on file    Minutes per session: Not on file  . Stress: Not on file  Relationships  . Social connections:    Talks on phone: Not on file    Gets together: Not on file    Attends religious service: Not on file    Active member of club or organization: Not on file    Attends meetings of clubs or organizations: Not on file    Relationship status: Not on file  . Intimate partner violence:    Fear of current or ex partner: Not on file    Emotionally abused: Not on file    Physically abused: Not on file    Forced sexual activity: Not on file  Other Topics Concern  . Not on file  Social History Narrative  . Not on file     PHYSICAL EXAM  Vitals:   08/06/17 0806  BP: 122/76  Pulse: 64  Weight: 249 lb 9.6 oz (113.2 kg)  Height: 5\' 8"  (1.727 m)   Body mass index is 37.95 kg/m.  Generalized: Well developed, obese male in no acute distress  Head: normocephalic and atraumatic,. Oropharynx benign mallopatti 5 Neck: Supple, circumference 19.5 Musculoskeletal: No deformity   Neurological examination   Mentation: Alert oriented to time, place, history taking. Attention span and concentration appropriate. Recent and remote memory intact.  Follows all commands speech and language fluent.   Cranial nerve II-XII: Pupils were equal round reactive to light extraocular movements were full, visual field were full on confrontational test. Facial sensation and strength were normal. hearing was intact to finger rubbing bilaterally. Uvula tongue midline. head turning and shoulder shrug were  normal and symmetric.Tongue protrusion into cheek strength was normal. Motor: normal bulk and tone, full strength in the BUE, BLE, Sensory: normal and symmetric to light touch,  Coordination: finger-nose-finger, heel-to-shin bilaterally, no dysmetria Gait and Station: Rising up from seated position without assistance, normal stance,  moderate stride, good arm swing, smooth turning, able to perform tiptoe, and heel walking without difficulty. Tandem gait is steady  DIAGNOSTIC DATA (LABS, IMAGING, TESTING) - I reviewed patient records, labs, notes, testing and imaging myself where available.   ASSESSMENT AND PLAN  42 y.o. year old male  has a past medical history of Allergy, GERD (gastroesophageal reflux disease), Hyperlipidemia, OSA (obstructive sleep apnea), Tobacco dependence, and Vitamin D deficiency. here with newly diagnosed obstructive sleep apnea here for CPAP compliance.  Data dated 05/07/2017-08/04/2017 shows compliance  greater than 4 hours for 26 days out of 90 at 29%.  Average usage 4 hours 10 minutes.  Set pressure 5 to 15 years.  EPR level 3 AHI 5.7.  ESS 7.   PLAN: CPAP compliance 29% continue same settings Try to use CPAP every night  Take medication for allergies, can use normal saline spray follow-up in 4 months Dennie Bible, [facility], [facility], APRN  [facility] [address] [place], [place] [address] [phone]

## 2017-08-06 ENCOUNTER — Encounter: Payer: Self-pay | Admitting: Nurse Practitioner

## 2017-08-06 ENCOUNTER — Ambulatory Visit: Payer: BC Managed Care – PPO | Admitting: Nurse Practitioner

## 2017-08-06 DIAGNOSIS — G4733 Obstructive sleep apnea (adult) (pediatric): Secondary | ICD-10-CM | POA: Insufficient documentation

## 2017-08-06 DIAGNOSIS — Z9989 Dependence on other enabling machines and devices: Secondary | ICD-10-CM

## 2017-08-06 NOTE — Patient Instructions (Signed)
CPAP compliance 29% continue same settings Try to use CPAP every night  follow-up in 4 months

## 2017-08-07 NOTE — Progress Notes (Signed)
I agree with the assessment and plan as directed by NP .The patient is known to me .   Dillin Lofgren, MD  

## 2017-11-19 DIAGNOSIS — Q828 Other specified congenital malformations of skin: Secondary | ICD-10-CM

## 2017-11-19 DIAGNOSIS — Z125 Encounter for screening for malignant neoplasm of prostate: Secondary | ICD-10-CM

## 2017-11-19 DIAGNOSIS — E559 Vitamin D deficiency, unspecified: Secondary | ICD-10-CM

## 2017-11-19 DIAGNOSIS — Z113 Encounter for screening for infections with a predominantly sexual mode of transmission: Secondary | ICD-10-CM

## 2017-11-19 DIAGNOSIS — E669 Obesity, unspecified: Secondary | ICD-10-CM

## 2017-11-19 DIAGNOSIS — Z23 Encounter for immunization: Secondary | ICD-10-CM | POA: Diagnosis not present

## 2017-11-19 DIAGNOSIS — Z0000 Encounter for general adult medical examination without abnormal findings: Secondary | ICD-10-CM

## 2017-11-19 DIAGNOSIS — Z716 Tobacco abuse counseling: Secondary | ICD-10-CM

## 2017-11-19 DIAGNOSIS — R7309 Other abnormal glucose: Secondary | ICD-10-CM

## 2017-11-19 DIAGNOSIS — N529 Male erectile dysfunction, unspecified: Secondary | ICD-10-CM

## 2017-11-19 DIAGNOSIS — K219 Gastro-esophageal reflux disease without esophagitis: Secondary | ICD-10-CM

## 2017-11-24 DIAGNOSIS — Q828 Other specified congenital malformations of skin: Secondary | ICD-10-CM

## 2017-11-24 DIAGNOSIS — N529 Male erectile dysfunction, unspecified: Secondary | ICD-10-CM | POA: Diagnosis not present

## 2017-11-24 DIAGNOSIS — E559 Vitamin D deficiency, unspecified: Secondary | ICD-10-CM

## 2017-12-07 ENCOUNTER — Encounter: Payer: Self-pay | Admitting: Nurse Practitioner

## 2017-12-08 NOTE — Progress Notes (Signed)
[facility]  PATIENT: Bruce Brooks. DOB: January 22, 1976   REASON FOR VISIT:Follow-up for newly diagnosed obstructive sleep apnea with  2nd CPAP compliance HISTORY FROM:patient    HISTORY OF PRESENT ILLNESS:UPDATE 9/25/2019[name], 42 year old male returns for follow-up with history of newly diagnosed obstructive sleep apnea here for his second CPAP compliance. Patient states when he uses his CPAP he feels much better more well rested.  Compliance data dated 11/08/2017 to 12/07/2017 shows greater than 4 hours at 17%.  Average usage 4 hours 26 minutes set pressure 5 to 15 cm.  EPR level 3 leak 95th percentile 19.5 AHI 5.1.  Is excuse for not being more compliant is that he works a lot of hours and he is so exhausted when he gets home he forgets to put his machine on.  He returns for reevaluation   UPDATE 5/23/2019[name], 42 year old male returns for follow-up with newly diagnosed obstructive sleep apnea here for initial CPAP compliance.  He claims he is not having any trouble with his machine he has had some allergy issues and due to the stuffiness has not always used it at night.  Compliance data dated 05/07/2017-08/04/2017 shows compliance greater than 4 hours for 26 days out of 90 at 29%.  Average usage 4 hours 10 minutes.  Set pressure 5 to 15 years.  EPR level 3 AHI 5.7.  ESS 7.  He returns for reevaluation 10/15/18CDFranklin C Brooks is a 42 y.o. male , seen here as in a referral  from Dr. Baird Cancer and NP [name] for a sleep apnea evaluation, after being told he snores and stops to breath at night in his sleep. Chief complaint according to patient : hypersomnia/ fatigue.  Bruce Brooks is a 42 year old African-American right-handed male patient, presenting with a feeling of increased sleepiness and fatigue in daytime, which  has slowly evolved over several years. He has changed his lifestyle in several ways, has adjusted his diet and has  implemented a more active lifestyle. He is in the process of reducing his tobacco use and endorsed now 6 cigarettes a day on average. He very rarely drinks alcohol, he drinks coffee during daytime mostly in the morning about 16 ounces daily.  Sleep habits are as follows: He goes to bed between 10-11 PM, after a late dinner at 9.30 PM. The patient usually sleeps on his side, or falls asleep on her side with 2 pillows for head support. He shares the bedroom with a male partner. He describes his bedroom as cool, quiet and dark. He has been told that he snores but he has never woken himself up from snoring. He has 2-3 time nocturia( 2.00- 4.00- finally at 6.30 AM).  Rises between 6.30 and 7 AM. He uses an alarm for backup, but is usually spontaneously awake at the time he needs to rise. He does not feel as refreshed and restored as he used to. He denies having a dry mouth, palpitations, nausea, dizziness or headaches/ pain. He endorsed joint stiffness. He estimates ( through fit bit ) 4-6 hours of sleep each night   REVIEW OF SYSTEMS: Full 14 system review of systems performed and notable only for those listed, all others are neg:  Constitutional: neg  Cardiovascular: neg Ear/Nose/Throat: neg  Skin: neg Eyes: neg Respiratory: neg Gastroitestinal: neg  Hematology/Lymphatic: neg  Endocrine: neg Musculoskeletal:neg Allergy/Immunology: neg Neurological: neg Psychiatric: neg Sleep : Obstructive sleep apnea with CPAP   ALLERGIES: No Known Allergies  HOME MEDICATIONS: Outpatient  Medications Prior to Visit  Medication Sig Dispense Refill  . ergocalciferol (VITAMIN D2) 50000 units capsule Take 50,000 Units by mouth once a week.     No facility-administered medications prior to visit.     PAST MEDICAL HISTORY: Past Medical History:  Diagnosis Date  . Allergy    seasonal  . GERD (gastroesophageal reflux disease)   . Hyperlipidemia   . OSA (obstructive sleep apnea)   . Tobacco dependence    . Vitamin D deficiency     PAST SURGICAL HISTORY: History reviewed. No pertinent surgical history.  FAMILY HISTORY: Family History  Problem Relation Age of Onset  . Colon polyps Father   . Colon cancer Neg Hx     SOCIAL HISTORY: Social History   Socioeconomic History  . Marital status: Single    Spouse name: Not on file  . Number of children: Not on file  . Years of education: Not on file  . Highest education level: Not on file  Occupational History  . Not on file  Social Needs  . Financial resource strain: Not on file  . Food insecurity:    Worry: Not on file    Inability: Not on file  . Transportation needs:    Medical: Not on file    Non-medical: Not on file  Tobacco Use  . Smoking status: Current Every Day Smoker    Packs/day: 0.50  . Smokeless tobacco: Never Used  . Tobacco comment: form given 07-30-11  Substance and Sexual Activity  . Alcohol use: Yes    Comment: occasional  . Drug use: No  . Sexual activity: Not on file  Lifestyle  . Physical activity:    Days per week: Not on file    Minutes per session: Not on file  . Stress: Not on file  Relationships  . Social connections:    Talks on phone: Not on file    Gets together: Not on file    Attends religious service: Not on file    Active member of club or organization: Not on file    Attends meetings of clubs or organizations: Not on file    Relationship status: Not on file  . Intimate partner violence:    Fear of current or ex partner: Not on file    Emotionally abused: Not on file    Physically abused: Not on file    Forced sexual activity: Not on file  Other Topics Concern  . Not on file  Social History Narrative  . Not on file     PHYSICAL EXAM  Vitals:   12/09/17 0906  BP: 133/69  Pulse: 63  Weight: 252 lb 6.4 oz (114.5 kg)  Height: 5\' 7"  (1.702 m)   Body mass index is 39.53 kg/m.  Generalized: Well developed, obese male in no acute distress  Head: normocephalic and  atraumatic,. Oropharynx benign mallopatti 5 Neck: Supple, circumference 19.5 Lungs clear Musculoskeletal: No deformity  Skin no rash or edema Neurological examination   Mentation: Alert oriented to time, place, history taking. Attention span and concentration appropriate. Recent and remote memory intact.  Follows all commands speech and language fluent.   Cranial nerve II-XII: Pupils were equal round reactive to light extraocular movements were full, visual field were full on confrontational test. Facial sensation and strength were normal. hearing was intact to finger rubbing bilaterally. Uvula tongue midline. head turning and shoulder shrug were normal and symmetric.Tongue protrusion into cheek strength was normal. Motor: normal bulk and tone, full strength  in the BUE, BLE, Sensory: normal and symmetric to light touch,  Coordination: finger-nose-finger, heel-to-shin bilaterally, no dysmetria Gait and Station: Rising up from seated position without assistance, normal stance,  moderate stride, good arm swing, smooth turning, able to perform tiptoe, and heel walking without difficulty. Tandem gait is steady  DIAGNOSTIC DATA (LABS, IMAGING, TESTING) - ASSESSMENT AND PLAN  42 y.o. year old male  has a past medical history of Allergy, GERD (gastroesophageal reflux disease), Hyperlipidemia, OSA (obstructive sleep apnea), Tobacco dependence, and Vitamin D deficiency. here with obstructive sleep apnea here for CPAP compliance. Data dated 11/08/2017 to 12/07/2017 shows greater than 4 hours at 17%.  Average usage 4 hours 26 minutes set pressure 5 to 15 cm.  EPR level 3 leak 95th percentile 19.5 AHI 5.1.   PLAN: CPAP compliance 17% continue same settings Try to use CPAP every night if you are not compliant [facility] will not pay for your supplies I explained in particular the risks and ramifications of untreated moderate to severe OSA, especially with respect to cardiovascular disease   including congestive heart failure, difficult to treat hypertension, cardiac arrhythmias, or stroke. Even type 2 diabetes has, in part, been linked to untreated OSA. Symptoms of untreated OSA include daytime sleepiness, memory problems, mood irritability and mood disorder such as depression and anxiety, lack of energy, as well as recurrent headaches, especially morning headaches.  follow-up in 4 months Dennie Bible, [facility], [facility], APRN  [facility] [address] [place], [place] [address] [phone]

## 2017-12-09 ENCOUNTER — Ambulatory Visit: Payer: BC Managed Care – PPO | Admitting: Nurse Practitioner

## 2017-12-09 ENCOUNTER — Encounter: Payer: Self-pay | Admitting: Nurse Practitioner

## 2017-12-09 VITALS — BP 133/69 | HR 63 | Ht 67.0 in | Wt 252.4 lb

## 2017-12-09 DIAGNOSIS — G4733 Obstructive sleep apnea (adult) (pediatric): Secondary | ICD-10-CM | POA: Diagnosis not present

## 2017-12-09 DIAGNOSIS — Z9989 Dependence on other enabling machines and devices: Secondary | ICD-10-CM

## 2017-12-09 NOTE — Patient Instructions (Signed)
CPAP compliance 17% continue same settings Try to use CPAP every night  I explained in particular the risks and ramifications of untreated moderate to severe OSA, especially with respect to cardiovascular disease  including congestive heart failure, difficult to treat hypertension, cardiac arrhythmias, or stroke. Even type 2 diabetes has, in part, been linked to untreated OSA. Symptoms of untreated OSA include daytime sleepiness, memory problems, mood irritability and mood disorder such as depression and anxiety, lack of energy, as well as recurrent headaches, especially morning headaches.  follow-up in 4 months

## 2017-12-15 ENCOUNTER — Encounter: Payer: Self-pay | Admitting: Sports Medicine

## 2017-12-15 ENCOUNTER — Ambulatory Visit: Payer: BC Managed Care – PPO | Admitting: Sports Medicine

## 2017-12-15 ENCOUNTER — Ambulatory Visit: Payer: BC Managed Care – PPO

## 2017-12-15 ENCOUNTER — Ambulatory Visit (INDEPENDENT_AMBULATORY_CARE_PROVIDER_SITE_OTHER): Payer: BC Managed Care – PPO

## 2017-12-15 VITALS — BP 121/81 | HR 67 | Resp 16

## 2017-12-15 DIAGNOSIS — M2141 Flat foot [pes planus] (acquired), right foot: Secondary | ICD-10-CM | POA: Diagnosis not present

## 2017-12-15 DIAGNOSIS — M722 Plantar fascial fibromatosis: Secondary | ICD-10-CM

## 2017-12-15 DIAGNOSIS — M79672 Pain in left foot: Secondary | ICD-10-CM

## 2017-12-15 DIAGNOSIS — M79671 Pain in right foot: Secondary | ICD-10-CM

## 2017-12-15 DIAGNOSIS — M2142 Flat foot [pes planus] (acquired), left foot: Secondary | ICD-10-CM

## 2017-12-15 MED ORDER — MELOXICAM 15 MG PO TABS: 15.0000 mg | ORAL_TABLET | Freq: Every day | ORAL | 0 refills | Status: DC

## 2017-12-15 MED ORDER — METHYLPREDNISOLONE 4 MG PO TBPK: ORAL_TABLET | ORAL | 0 refills | Status: DC

## 2017-12-15 NOTE — Progress Notes (Signed)
Subjective: Bruce Brooks. is a 42 y.o. male patient presents to office with complaint of moderate heel pain on the left>right that has gotten better since January. Patient states pain in now 3-4/10. Patient admits to post static dyskinesia in the AM that is worse. Patient has treated this problem with changing shoes with no relief. Denies any other pedal complaints. Admits to history of weight gain.   Review of Systems  Musculoskeletal:       Heel pain  All other systems reviewed and are negative.    Patient Active Problem List   Diagnosis Date Noted  . Obstructive sleep apnea treated with continuous positive airway pressure (CPAP) 08/06/2017  . Snoring 02/09/2017  . Excessive daytime sleepiness 02/09/2017  . Obesity (BMI 35.0-39.9 without comorbidity) 02/09/2017  . Tobacco use disorder 02/09/2017  . Nocturia more than twice per night 02/09/2017    Current Outpatient Medications on File Prior to Visit  Medication Sig Dispense Refill  . ergocalciferol (VITAMIN D2) 50000 units capsule Take 50,000 Units by mouth once a week.     No current facility-administered medications on file prior to visit.     No Known Allergies  Objective: Physical Exam General: The patient is alert and oriented x3 in no acute distress.  Dermatology: Skin is warm, dry and supple bilateral lower extremities. Nails 1-10 are normal. There is no erythema, edema, no eccymosis, no open lesions present. Integument is otherwise unremarkable.  Vascular: Dorsalis Pedis pulse and Posterior Tibial pulse are 2/4 bilateral. Capillary fill time is immediate to all digits.  Neurological: Grossly intact to light touch with an achilles reflex of +2/5 and a  negative Tinel's sign bilateral.  Musculoskeletal: Tenderness to palpation at the medial calcaneal tubercale and through the insertion of the plantar fascia on the left but no pain on the right foot. No pain with compression of calcaneus bilateral. No pain  with tuning fork to calcaneus bilateral. No pain with calf compression bilateral. There is decreased Ankle joint range of motion bilateral. All other joints range of motion within normal limits bilateral. + Pes planus. Strength 5/5 in all groups bilateral.   Gait: Unassisted, Antalgic avoid weight on left heel   Xray, Right/Left foot:  Normal osseous mineralization. Joint spaces preserved except midtarsal supportive of pes planus. No fracture/dislocation/boney destruction. Posterior Calcaneal spur present with mild thickening of plantar fascia. No other soft tissue abnormalities or radiopaque foreign bodies.   Assessment and Plan: Problem List Items Addressed This Visit    None    Visit Diagnoses    Plantar fasciitis    -  Primary   L>R   Relevant Medications   methylPREDNISolone (MEDROL DOSEPAK) 4 MG TBPK tablet   meloxicam (MOBIC) 15 MG tablet   Other Relevant Orders   DG Foot Complete Right (Completed)   DG Foot Complete Left (Completed)   Heel pain, bilateral       Pes planus of both feet          -Complete examination performed.  -Xrays reviewed -Discussed with patient in detail the condition of plantar fasciitis, how this occurs and general treatment options. Explained both conservative and surgical treatments.  -Rx Meloxicam to start after Medrol dose pack is completed -Recommended good supportive shoes and advised work note to allow these shoes - Explained in detail the use of the fascial brace for left which was dispensed at today's visit. -Explained and dispensed to patient daily stretching exercises. -Recommend patient to ice affected area 1-2x daily. -  Patient to return to office in 4 weeks for follow up or sooner if problems or questions arise.  Landis Martins, DPM

## 2017-12-15 NOTE — Patient Instructions (Signed)

## 2018-01-12 ENCOUNTER — Ambulatory Visit: Payer: BC Managed Care – PPO | Admitting: Sports Medicine

## 2018-01-12 ENCOUNTER — Encounter: Payer: Self-pay | Admitting: Sports Medicine

## 2018-01-12 DIAGNOSIS — R030 Elevated blood-pressure reading, without diagnosis of hypertension: Secondary | ICD-10-CM | POA: Insufficient documentation

## 2018-01-12 DIAGNOSIS — M79671 Pain in right foot: Secondary | ICD-10-CM

## 2018-01-12 DIAGNOSIS — Z0000 Encounter for general adult medical examination without abnormal findings: Secondary | ICD-10-CM | POA: Insufficient documentation

## 2018-01-12 DIAGNOSIS — E7849 Other hyperlipidemia: Secondary | ICD-10-CM | POA: Insufficient documentation

## 2018-01-12 DIAGNOSIS — M79672 Pain in left foot: Secondary | ICD-10-CM

## 2018-01-12 DIAGNOSIS — M722 Plantar fascial fibromatosis: Secondary | ICD-10-CM

## 2018-01-12 DIAGNOSIS — F17210 Nicotine dependence, cigarettes, uncomplicated: Secondary | ICD-10-CM | POA: Insufficient documentation

## 2018-01-12 DIAGNOSIS — M2141 Flat foot [pes planus] (acquired), right foot: Secondary | ICD-10-CM

## 2018-01-12 DIAGNOSIS — M779 Enthesopathy, unspecified: Secondary | ICD-10-CM

## 2018-01-12 DIAGNOSIS — M2142 Flat foot [pes planus] (acquired), left foot: Secondary | ICD-10-CM

## 2018-01-12 DIAGNOSIS — Z716 Tobacco abuse counseling: Secondary | ICD-10-CM | POA: Insufficient documentation

## 2018-01-12 NOTE — Progress Notes (Signed)
Subjective: Bruce Brooks. is a 42 y.o. male returns to office for follow up evaluation of bilateral heel pain. Reports that the pain is better but not completely gone has new area of pain at the side of left foot and sometimes on right heel that is slight but feels much better with stretching and icing and Mobic. Reports no other issues.  Denies changes in health medicine since last visit, denies nausea, vomiting, fever, chills or any other symptoms at this time.   Patient Active Problem List   Diagnosis Date Noted  . Elevated blood pressure reading without diagnosis of hypertension 01/12/2018  . Encntr for general adult medical exam w/o abnormal findings 01/12/2018  . Nicotine dependence, cigarettes, uncomplicated 31/51/7616  . Other hyperlipidemia 01/12/2018  . Tobacco abuse counseling 01/12/2018  . Obstructive sleep apnea treated with continuous positive airway pressure (CPAP) 08/06/2017  . Snoring 02/09/2017  . Excessive daytime sleepiness 02/09/2017  . Obesity (BMI 35.0-39.9 without comorbidity) 02/09/2017  . Tobacco use disorder 02/09/2017  . Nocturia more than twice per night 02/09/2017    Current Outpatient Medications on File Prior to Visit  Medication Sig Dispense Refill  . ergocalciferol (VITAMIN D2) 50000 units capsule Take 50,000 Units by mouth once a week.    . meloxicam (MOBIC) 15 MG tablet Take 1 tablet (15 mg total) by mouth daily. 30 tablet 0  . methylPREDNISolone (MEDROL DOSEPAK) 4 MG TBPK tablet Take as directed 21 tablet 0   No current facility-administered medications on file prior to visit.     No Known Allergies  Objective:   General:  Alert and oriented x 3, in no acute distress  Dermatology: Skin is warm, dry, and supple bilateral. Nails are within normal limits. There is no lower extremity erythema, no eccymosis, no open lesions present bilateral.   Vascular: Dorsalis Pedis and Posterior Tibial pedal pulses are 2/4 bilateral. + hair growth  noted bilateral. Capillary Fill Time is 3 seconds in all digits. No varicosities, No edema bilateral lower extremities.   Neurological: Sensation grossly intact to light touch with an achilles reflex of +2 and a  negative Tinel's sign bilateral. Vibratory, sharp/dull, Semmes Weinstein Monofilament within normal limits.   Musculoskeletal: There is no significant reproducible tenderness to palpation at the medial calcaneal tubercale and through the insertion of the plantar fascia on the right foot.  There is mild pain to palpation along the peroneal tendon course on the left with no instability, no pain with compression to calcaneus or application of tuning fork. There is decreased Ankle joint range of motion bilateral. All other joints range of motion  within normal limits bilateral.  Pes planus foot type bilateral.  Strength 5/5 bilateral.   Assessment and Plan: Problem List Items Addressed This Visit    None    Visit Diagnoses    Plantar fasciitis    -  Primary   Heel pain, bilateral       Pes planus of both feet       Tendonitis          -Complete examination performed.  -Previous x-rays reviewed. -Discussed with patient in detail the condition of plantar fasciitis and tendonitis, how this  occurs related to the foot type of the patient and general treatment options. -Continue with stretching, icing, good supportive shoes -Rx Custom insoles; Dawn to call to discuss benefits  -Patient to return to office for custom functional foot orthotics or sooner if problems or questions arise.  Landis Martins, DPM

## 2018-01-25 ENCOUNTER — Ambulatory Visit: Payer: BC Managed Care – PPO | Admitting: Orthotics

## 2018-01-25 DIAGNOSIS — M2141 Flat foot [pes planus] (acquired), right foot: Secondary | ICD-10-CM | POA: Diagnosis not present

## 2018-01-25 DIAGNOSIS — M2142 Flat foot [pes planus] (acquired), left foot: Secondary | ICD-10-CM

## 2018-01-25 DIAGNOSIS — M79672 Pain in left foot: Secondary | ICD-10-CM

## 2018-01-25 DIAGNOSIS — M722 Plantar fascial fibromatosis: Secondary | ICD-10-CM

## 2018-01-25 DIAGNOSIS — M79671 Pain in right foot: Secondary | ICD-10-CM

## 2018-01-25 NOTE — Progress Notes (Signed)

## 2018-02-15 ENCOUNTER — Ambulatory Visit: Payer: BC Managed Care – PPO | Admitting: Orthotics

## 2018-02-15 DIAGNOSIS — M2141 Flat foot [pes planus] (acquired), right foot: Secondary | ICD-10-CM

## 2018-02-15 DIAGNOSIS — M79672 Pain in left foot: Principal | ICD-10-CM

## 2018-02-15 DIAGNOSIS — M2142 Flat foot [pes planus] (acquired), left foot: Secondary | ICD-10-CM

## 2018-02-15 DIAGNOSIS — M79671 Pain in right foot: Secondary | ICD-10-CM

## 2018-02-15 DIAGNOSIS — M722 Plantar fascial fibromatosis: Secondary | ICD-10-CM

## 2018-02-15 NOTE — Progress Notes (Signed)
Patient came in today to pick up custom made foot orthotics.  The goals were accomplished and the patient reported no dissatisfaction with said orthotics.  Patient was advised of breakin period and how to report any issues. 

## 2018-02-23 ENCOUNTER — Telehealth: Payer: Self-pay | Admitting: Nurse Practitioner

## 2018-02-23 NOTE — Telephone Encounter (Signed)
PLS SEND IN VITAMIN D TO PHARMACY, PT ALSO REQ RX OF CIALIS PLS CALL PT TO ADVISE

## 2018-02-24 ENCOUNTER — Other Ambulatory Visit: Payer: Self-pay | Admitting: Nurse Practitioner

## 2018-02-24 ENCOUNTER — Other Ambulatory Visit: Payer: Self-pay

## 2018-02-24 DIAGNOSIS — N529 Male erectile dysfunction, unspecified: Secondary | ICD-10-CM

## 2018-02-24 MED ORDER — ERGOCALCIFEROL 1.25 MG (50000 UT) PO CAPS: 50000.0000 [IU] | ORAL_CAPSULE | ORAL | 0 refills | Status: DC

## 2018-02-24 MED ORDER — TADALAFIL 10 MG PO TABS: 10.0000 mg | ORAL_TABLET | ORAL | 1 refills | Status: DC | PRN

## 2018-02-24 NOTE — Telephone Encounter (Signed)
I will send the vitamin d, I will send a one time dose of cialis but he needs to come back in for recheck testosterone because this may be causing you problems with erection.

## 2018-02-24 NOTE — Telephone Encounter (Signed)
Patient notified YRL,RMA 

## 2018-03-25 ENCOUNTER — Encounter: Payer: Self-pay | Admitting: Nurse Practitioner

## 2018-03-25 ENCOUNTER — Ambulatory Visit: Payer: BC Managed Care – PPO | Admitting: Nurse Practitioner

## 2018-03-25 VITALS — BP 110/80 | HR 84 | Temp 98.4°F | Ht 67.0 in | Wt 251.4 lb

## 2018-03-25 DIAGNOSIS — N529 Male erectile dysfunction, unspecified: Secondary | ICD-10-CM | POA: Diagnosis not present

## 2018-03-25 NOTE — Patient Instructions (Signed)
Erectile Dysfunction  Erectile dysfunction (ED) is the inability to get or keep an erection in order to have sexual intercourse. Erectile dysfunction may include:   Inability to get an erection.   Lack of enough hardness of the erection to allow penetration.   Loss of the erection before sex is finished.  What are the causes?  This condition may be caused by:   Certain medicines, such as:  ? Pain relievers.  ? Antihistamines.  ? Antidepressants.  ? Blood pressure medicines.  ? Water pills (diuretics).  ? Ulcer medicines.  ? Muscle relaxants.  ? Drugs.   Excessive drinking.   Psychological causes, such as:  ? Anxiety.  ? Depression.  ? Sadness.  ? Exhaustion.  ? Performance fear.  ? Stress.   Physical causes, such as:  ? Artery problems. This may include diabetes, smoking, liver disease, or atherosclerosis.  ? High blood pressure.  ? Hormonal problems, such as low testosterone.  ? Obesity.  ? Nerve problems. This may include back or pelvic injuries, diabetes mellitus, multiple sclerosis, or Parkinson disease.  What are the signs or symptoms?  Symptoms of this condition include:   Inability to get an erection.   Lack of enough hardness of the erection to allow penetration.   Loss of the erection before sex is finished.   Normal erections at some times, but with frequent unsatisfactory episodes.   Low sexual satisfaction in either partner due to erection problems.   A curved penis occurring with erection. The curve may cause pain or the penis may be too curved to allow for intercourse.   Never having nighttime erections.  How is this diagnosed?  This condition is often diagnosed by:   Performing a physical exam to find other diseases or specific problems with the penis.   Asking you detailed questions about the problem.   Performing blood tests to check for diabetes mellitus or to measure hormone levels.   Performing other tests to check for underlying health conditions.   Performing an ultrasound  exam to check for scarring.   Performing a test to check blood flow to the penis.   Doing a sleep study at home to measure nighttime erections.  How is this treated?  This condition may be treated by:   Medicine taken by mouth to help you achieve an erection (oral medicine).   Hormone replacement therapy to replace low testosterone levels.   Medicine that is injected into the penis. Your health care provider may instruct you how to give yourself these injections at home.   Vacuum pump. This is a pump with a ring on it. The pump and ring are placed on the penis and used to create pressure that helps the penis become erect.   Penile implant surgery. In this procedure, you may receive:  ? An inflatable implant. This consists of cylinders, a pump, and a reservoir. The cylinders can be inflated with a fluid that helps to create an erection, and they can be deflated after intercourse.  ? A semi-rigid implant. This consists of two silicone rubber rods. The rods provide some rigidity. They are also flexible, so the penis can both curve downward in its normal position and become straight for sexual intercourse.   Blood vessel surgery, to improve blood flow to the penis. During this procedure, a blood vessel from a different part of the body is placed into the penis to allow blood to flow around (bypass) damaged or blocked blood vessels.     Lifestyle changes, such as exercising more, losing weight, and quitting smoking.  Follow these instructions at home:  Medicines     Take over-the-counter and prescription medicines only as told by your health care provider. Do not increase the dosage without first discussing it with your health care provider.   If you are using self-injections, perform injections as directed by your health care provider. Make sure to avoid any veins that are on the surface of the penis. After giving an injection, apply pressure to the injection site for 5 minutes.  General  instructions   Exercise regularly, as directed by your health care provider. Work with your health care provider to lose weight, if needed.   Do not use any products that contain nicotine or tobacco, such as cigarettes and e-cigarettes. If you need help quitting, ask your health care provider.   Before using a vacuum pump, read the instructions that come with the pump and discuss any questions with your health care provider.   Keep all follow-up visits as told by your health care provider. This is important.  Contact a health care provider if:   You feel nauseous.   You vomit.  Get help right away if:   You are taking oral or injectable medicines and you have an erection that lasts longer than 4 hours. If your health care provider is unavailable, go to the nearest emergency room for evaluation. An erection that lasts much longer than 4 hours can result in permanent damage to your penis.   You have severe pain in your groin or abdomen.   You develop redness or severe swelling of your penis.   You have redness spreading up into your groin or lower abdomen.   You are unable to urinate.   You experience chest pain or a rapid heart beat (palpitations) after taking oral medicines.  Summary   Erectile dysfunction (ED) is the inability to get or keep an erection during sexual intercourse. This problem can usually be treated successfully.   This condition is diagnosed based on a physical exam, your symptoms, and tests to determine the cause. Treatment varies depending on the cause, and may include medicines, hormone therapy, surgery, or vacuum pump.   You may need follow-up visits to make sure that you are using your medicines or devices correctly.   Get help right away if you are taking or injecting medicines and you have an erection that lasts longer than 4 hours.  This information is not intended to replace advice given to you by your health care provider. Make sure you discuss any questions you have with  your health care provider.  Document Released: 02/29/2000 Document Revised: 03/19/2016 Document Reviewed: 03/19/2016  [employer]vier Interactive Patient Education  2019 [employer]

## 2018-03-25 NOTE — Progress Notes (Signed)
  Subjective:     Patient ID: Bruce Brooks , male    DOB: [birth year] , 43 y.o.   MRN: [id number]   Chief Complaint  Patient presents with  . MED CHECK    patient presents today for a recheck on his testosterone due to him taking cialis     HPI  Here for follow up having difficulty with initiating intercourse.  Working increased hours at work.  Does not have any additional stressors that could interfere. On and off for 3 years.  He had some samples of Viagra previously.  He tells me the cialis was $1300.    When he uses his CPAP sleeps well.    Trying to focus on a healthy diet and he is exercising 2 times per week 45 minutes per day.      Past Medical History:  Diagnosis Date  . Allergy    seasonal  . GERD (gastroesophageal reflux disease)   . Hyperlipidemia   . OSA (obstructive sleep apnea)   . Tobacco dependence   . Vitamin D deficiency      Family History  Problem Relation Age of Onset  . Colon polyps Father   . Colon cancer Neg Hx      Current Outpatient Medications:  .  ergocalciferol (VITAMIN D2) 1.25 MG (50000 UT) capsule, Take 1 capsule (50,000 Units total) by mouth once a week., Disp: 24 capsule, Rfl: 0 .  tadalafil (CIALIS) 10 MG tablet, Take 1 tablet (10 mg total) by mouth as needed for erectile dysfunction. (Patient not taking: Reported on 03/25/2018), Disp: 20 tablet, Rfl: 1   No Known Allergies   Review of Systems  Constitutional: Negative for fatigue.  Eyes: Negative for photophobia.  Respiratory: Negative.   Cardiovascular: Negative.  Negative for chest pain, palpitations and leg swelling.  Genitourinary: Negative for discharge, flank pain, penile pain, penile swelling, scrotal swelling and testicular pain.     Today's Vitals   03/25/18 0855  BP: 110/80  Pulse: 84  Temp: 98.4 F (36.9 C)  TempSrc: Oral  SpO2: 97%  Weight: 251 lb 6.4 oz (114 kg)  Height: 5\' 7"  (1.702 m)  PainSc: 0-No pain   Body mass index is 39.37 kg/m.    Objective:  Physical Exam Vitals signs reviewed.  Constitutional:      Appearance: Normal appearance.  Pulmonary:     Effort: Pulmonary effort is normal.  Skin:    General: Skin is warm.  Neurological:     Mental Status: He is alert.         Assessment And Plan:     1. Erectile dysfunction, unspecified erectile dysfunction type  Will recheck his testosterone levels today pending results will refer to urology due to him wanting to have children and testosterone therapy may affect this ability.    He is not taking Cialis due to the cost. He is encouraged to call his insurance company to inquire on which medication is covered in the same class.  I have strongly encouraged him to increase his exercise to 150 minutes per week and eat a healthy diet.  - Testosterone      [name], FNP

## 2018-03-26 ENCOUNTER — Other Ambulatory Visit: Payer: Self-pay | Admitting: Nurse Practitioner

## 2018-03-26 DIAGNOSIS — R7989 Other specified abnormal findings of blood chemistry: Secondary | ICD-10-CM

## 2018-03-26 DIAGNOSIS — N529 Male erectile dysfunction, unspecified: Secondary | ICD-10-CM

## 2018-03-26 LAB — TESTOSTERONE: Testosterone: 248 ng/dL — ABNORMAL LOW (ref 264–916)

## 2018-04-09 ENCOUNTER — Other Ambulatory Visit: Payer: Self-pay

## 2018-04-09 ENCOUNTER — Other Ambulatory Visit: Payer: Self-pay | Admitting: Nurse Practitioner

## 2018-04-09 DIAGNOSIS — N529 Male erectile dysfunction, unspecified: Secondary | ICD-10-CM

## 2018-04-09 MED ORDER — TADALAFIL 10 MG PO TABS: 10.0000 mg | ORAL_TABLET | ORAL | 0 refills | Status: DC | PRN

## 2018-04-12 ENCOUNTER — Telehealth: Payer: Self-pay

## 2018-04-12 NOTE — Telephone Encounter (Signed)
Spoke with pt and advised him that his refill was sent to the pharmacy and we wanted to know if he has been to the urologist yet? Pt stated he has not gotten an appointment yet and I also advised him that we will not be filling the tadalafil anymore he needs to see the urologist first per [name] FNP-BC. YRL,RMA

## 2018-04-14 ENCOUNTER — Ambulatory Visit: Payer: BC Managed Care – PPO | Admitting: Nurse Practitioner

## 2018-04-14 ENCOUNTER — Encounter: Payer: Self-pay | Admitting: Nurse Practitioner

## 2018-04-14 VITALS — BP 114/80 | HR 64 | Temp 98.3°F | Ht 67.6 in | Wt 248.8 lb

## 2018-04-14 DIAGNOSIS — B9789 Other viral agents as the cause of diseases classified elsewhere: Secondary | ICD-10-CM | POA: Diagnosis not present

## 2018-04-14 DIAGNOSIS — J988 Other specified respiratory disorders: Secondary | ICD-10-CM | POA: Diagnosis not present

## 2018-04-14 DIAGNOSIS — R05 Cough: Secondary | ICD-10-CM

## 2018-04-14 DIAGNOSIS — R059 Cough, unspecified: Secondary | ICD-10-CM

## 2018-04-14 NOTE — Patient Instructions (Signed)
Viral Respiratory Infection  A viral respiratory infection is an illness that affects parts of the body that are used for breathing. These include the lungs, nose, and throat. It is caused by a germ called a virus.  Some examples of this kind of infection are:  · A cold.  · The flu (influenza).  · A respiratory syncytial virus (RSV) infection.  A person who gets this illness may have the following symptoms:  · A stuffy or runny nose.  · Yellow or green fluid in the nose.  · A cough.  · Sneezing.  · Tiredness (fatigue).  · Achy muscles.  · A sore throat.  · Sweating or chills.  · A fever.  · A headache.  Follow these instructions at home:  Managing pain and congestion  · Take over-the-counter and prescription medicines only as told by your doctor.  · If you have a sore throat, gargle with salt water. Do this 3-4 times per day or as needed. To make a salt-water mixture, dissolve ½-1 tsp of salt in 1 cup of warm water. Make sure that all the salt dissolves.  · Use nose drops made from salt water. This helps with stuffiness (congestion). It also helps soften the skin around your nose.  · Drink enough fluid to keep your pee (urine) pale yellow.  General instructions    · Rest as much as possible.  · Do not drink alcohol.  · Do not use any products that have nicotine or tobacco, such as cigarettes and e-cigarettes. If you need help quitting, ask your doctor.  · Keep all follow-up visits as told by your doctor. This is important.  How is this prevented?    · Get a flu shot every year. Ask your doctor when you should get your flu shot.  · Do not let other people get your germs. If you are sick:  ? Stay home from work or school.  ? Wash your hands with soap and water often. Wash your hands after you cough or sneeze. If soap and water are not available, use hand sanitizer.  · Avoid contact with people who are sick during cold and flu season. This is in fall and winter.  Get help if:  · Your symptoms last for 10 days or  longer.  · Your symptoms get worse over time.  · You have a fever.  · You have very bad pain in your face or forehead.  · Parts of your jaw or neck become very swollen.  Get help right away if:  · You feel pain or pressure in your chest.  · You have shortness of breath.  · You faint or feel like you will faint.  · You keep throwing up (vomiting).  · You feel confused.  Summary  · A viral respiratory infection is an illness that affects parts of the body that are used for breathing.  · Examples of this illness include a cold, the flu, and respiratory syncytial virus (RSV) infection.  · The infection can cause a runny nose, cough, sneezing, sore throat, and fever.  · Follow what your doctor tells you about taking medicines, drinking lots of fluid, washing your hands, resting at home, and avoiding people who are sick.  This information is not intended to replace advice given to you by your health care provider. Make sure you discuss any questions you have with your health care provider.  Document Released: 02/14/2008 Document Revised: 04/13/2017 Document Reviewed: 04/13/2017  Elsevier   Interactive Patient Education © 2019 [employer] Inc[employer]

## 2018-04-14 NOTE — Progress Notes (Signed)
Subjective:     Patient ID: Bruce Brooks , male    DOB: Jan 23, 1976 , 43 y.o.   MRN: [id number]   Chief Complaint  Patient presents with  . URI    patient states he has been having coughing spells, chills, night sweats  pt states his symptoms started monday. pt states everyone in his house has bronchitis.    HPI  Today feels better.  Continues to cough intermittently.    URI   This is a chronic problem. The current episode started in the past 7 days. The problem has been gradually improving. Maximum temperature: reports fever unknown  Associated symptoms include coughing (dry cough) and a sore throat. Pertinent negatives include no chest pain, congestion, nausea or sinus pain. Treatments tried: ibuprofen and cough drops. The treatment provided mild relief.     Past Medical History:  Diagnosis Date  . Allergy    seasonal  . GERD (gastroesophageal reflux disease)   . Hyperlipidemia   . OSA (obstructive sleep apnea)   . Tobacco dependence   . Vitamin D deficiency      Family History  Problem Relation Age of Onset  . Colon polyps Father   . Colon cancer Neg Hx      Current Outpatient Medications:  .  ergocalciferol (VITAMIN D2) 1.25 MG (50000 UT) capsule, Take 1 capsule (50,000 Units total) by mouth once a week., Disp: 24 capsule, Rfl: 0 .  tadalafil (CIALIS) 10 MG tablet, Take 1 tablet (10 mg total) by mouth as needed for erectile dysfunction., Disp: 30 tablet, Rfl: 0   No Known Allergies   Review of Systems  Constitutional: Negative for fatigue and fever.  HENT: Positive for sore throat. Negative for congestion and sinus pain.   Respiratory: Positive for cough (dry cough). Negative for chest tightness.   Cardiovascular: Negative for chest pain, palpitations and leg swelling.  Gastrointestinal: Negative for nausea.     Today's Vitals   04/14/18 0915  BP: 114/80  Pulse: 64  Temp: 98.3 F (36.8 C)  TempSrc: Oral  Weight: 248 lb 12.8 oz (112.9 kg)   Height: 5' 7.6" (1.717 m)  PainSc: 0-No pain   Body mass index is 38.28 kg/m.   Objective:  Physical Exam Constitutional:      Appearance: Normal appearance.  HENT:     Head: Normocephalic and atraumatic.     Right Ear: Tympanic membrane, ear canal and external ear normal. There is no impacted cerumen.     Left Ear: Tympanic membrane, ear canal and external ear normal. There is no impacted cerumen.     Nose: Nose normal. No congestion.     Mouth/Throat:     Mouth: Mucous membranes are moist.  Eyes:     Extraocular Movements: Extraocular movements intact.     Conjunctiva/sclera: Conjunctivae normal.     Pupils: Pupils are equal, round, and reactive to light.  Cardiovascular:     Rate and Rhythm: Normal rate and regular rhythm.     Pulses: Normal pulses.     Heart sounds: No murmur. No friction rub.  Pulmonary:     Effort: Pulmonary effort is normal. No respiratory distress.     Breath sounds: Normal breath sounds. No wheezing.  Neurological:     Mental Status: He is alert.  Psychiatric:        Mood and Affect: Mood normal.         Assessment And Plan:     1. Cough  Occasional cough  dry, will continue with over the counter cough medicine, recommend Delsym as needed  If worsens return to office by end of week  No abnormal breath sounds noted  2. Viral respiratory illness  Symptomatic treatment   [name], FNP

## 2018-04-18 ENCOUNTER — Encounter: Payer: Self-pay | Admitting: Nurse Practitioner

## 2018-04-19 NOTE — Progress Notes (Signed)
[facility]  PATIENT: Bruce Brooks. DOB: [birth year]   REASON FOR VISIT:Follow-up for  obstructive sleep apnea with   CPAP compliance HISTORY FROM:patient    HISTORY OF PRESENT ILLNESS:UPDATE 2/4/2020CM Mr. Harrower, 43 year old male returns for follow-up with history of obstructive sleep apnea here for CPAP compliance.  He has less daytime sleepiness on CPAP.  Only wakes up once during the night to go to the restroom.  Compliance data dated 03/20/2018-04/18/2018 shows compliance greater than 4 hours and 80% for 24 days.  Less than 4 hours at 17% for 5 days. Total  Usage 97% 29 out of 30 days.  Average usage 5 hours 34 minutes.  Set pressure 5 to 15 cm.  EPR level 3 leak 95th percentile 11.7 AHI 2.8 he returns for reevaluation    UPDATE 9/25/2019CM Mr. Wohler, 43 year old male returns for follow-up with history of newly diagnosed obstructive sleep apnea here for his second CPAP compliance. Patient states when he uses his CPAP he feels much better more well rested.  Compliance data dated 11/08/2017 to 12/07/2017 shows greater than 4 hours at 17%.  Average usage 4 hours 26 minutes set pressure 5 to 15 cm.  EPR level 3 leak 95th percentile 19.5 AHI 5.1.  Is excuse for not being more compliant is that he works a lot of hours and he is so exhausted when he gets home he forgets to put his machine on.  He returns for reevaluation   UPDATE 5/23/2019CM Mr. Spoelstra, 43 year old male returns for follow-up with newly diagnosed obstructive sleep apnea here for initial CPAP compliance.  He claims he is not having any trouble with his machine he has had some allergy issues and due to the stuffiness has not always used it at night.  Compliance data dated 05/07/2017-08/04/2017 shows compliance greater than 4 hours for 26 days out of 90 at 29%.  Average usage 4 hours 10 minutes.  Set pressure 5 to 15 years.  EPR level 3 AHI 5.7.  ESS 7.  He returns for reevaluation 10/15/18CDFranklin C  Hocker is a 43 y.o. male , seen here as in a referral  from Dr. Baird Cancer and NP [name] for a sleep apnea evaluation, after being told he snores and stops to breath at night in his sleep. Chief complaint according to patient : hypersomnia/ fatigue.  Mr. Zenon is a 43 year old African-American right-handed male patient, presenting with a feeling of increased sleepiness and fatigue in daytime, which  has slowly evolved over several years. He has changed his lifestyle in several ways, has adjusted his diet and has implemented a more active lifestyle. He is in the process of reducing his tobacco use and endorsed now 6 cigarettes a day on average. He very rarely drinks alcohol, he drinks coffee during daytime mostly in the morning about 16 ounces daily.  Sleep habits are as follows: He goes to bed between 10-11 PM, after a late dinner at 9.30 PM. The patient usually sleeps on his side, or falls asleep on her side with 2 pillows for head support. He shares the bedroom with a male partner. He describes his bedroom as cool, quiet and dark. He has been told that he snores but he has never woken himself up from snoring. He has 2-3 time nocturia( 2.00- 4.00- finally at 6.30 AM).  Rises between 6.30 and 7 AM. He uses an alarm for backup, but is usually spontaneously awake at the time he needs to rise. He does not feel as  refreshed and restored as he used to. He denies having a dry mouth, palpitations, nausea, dizziness or headaches/ pain. He endorsed joint stiffness. He estimates ( through fit bit ) 4-6 hours of sleep each night   REVIEW OF SYSTEMS: Full 14 system review of systems performed and notable only for those listed, all others are neg:  Constitutional: neg  Cardiovascular: neg Ear/Nose/Throat: neg  Skin: neg Eyes: neg Respiratory: neg Gastroitestinal: neg  Hematology/Lymphatic: neg  Endocrine: neg Musculoskeletal:neg Allergy/Immunology: neg Neurological: neg Psychiatric: neg Sleep :  Obstructive sleep apnea with CPAP   ALLERGIES: No Known Allergies  HOME MEDICATIONS: Outpatient Medications Prior to Visit  Medication Sig Dispense Refill  . ergocalciferol (VITAMIN D2) 1.25 MG (50000 UT) capsule Take 1 capsule (50,000 Units total) by mouth once a week. 24 capsule 0  . tadalafil (CIALIS) 10 MG tablet Take 1 tablet (10 mg total) by mouth as needed for erectile dysfunction. 30 tablet 0   No facility-administered medications prior to visit.     PAST MEDICAL HISTORY: Past Medical History:  Diagnosis Date  . Allergy    seasonal  . GERD (gastroesophageal reflux disease)   . Hyperlipidemia   . OSA (obstructive sleep apnea)   . Tobacco dependence   . Vitamin D deficiency     PAST SURGICAL HISTORY: History reviewed. No pertinent surgical history.  FAMILY HISTORY: Family History  Problem Relation Age of Onset  . Colon polyps Father   . Colon cancer Neg Hx     SOCIAL HISTORY: Social History   Socioeconomic History  . Marital status: Single    Spouse name: Not on file  . Number of children: Not on file  . Years of education: Not on file  . Highest education level: Not on file  Occupational History  . Not on file  Social Needs  . Financial resource strain: Not on file  . Food insecurity:    Worry: Not on file    Inability: Not on file  . Transportation needs:    Medical: Not on file    Non-medical: Not on file  Tobacco Use  . Smoking status: Current Every Day Smoker    Packs/day: 0.50  . Smokeless tobacco: Never Used  . Tobacco comment: form given 07-30-11  Substance and Sexual Activity  . Alcohol use: Yes    Comment: occasional  . Drug use: No  . Sexual activity: Not on file  Lifestyle  . Physical activity:    Days per week: Not on file    Minutes per session: Not on file  . Stress: Not on file  Relationships  . Social connections:    Talks on phone: Not on file    Gets together: Not on file    Attends religious service: Not on file     Active member of club or organization: Not on file    Attends meetings of clubs or organizations: Not on file    Relationship status: Not on file  . Intimate partner violence:    Fear of current or ex partner: Not on file    Emotionally abused: Not on file    Physically abused: Not on file    Forced sexual activity: Not on file  Other Topics Concern  . Not on file  Social History Narrative  . Not on file     PHYSICAL EXAM  Vitals:   04/20/18 0905  BP: 105/69  Pulse: 64  Weight: 250 lb 3.2 oz (113.5 kg)  Height: 5\' 7"  (1.702  m)   Body mass index is 39.19 kg/m.  Generalized: Well developed, obese male in no acute distress  Head: normocephalic and atraumatic,. Oropharynx benign mallopatti 5 Neck: Supple, circumference 19.5 Lungs clear Musculoskeletal: No deformity  Skin no rash or edema Neurological examination   Mentation: Alert oriented to time, place, history taking. Attention span and concentration appropriate. Recent and remote memory intact.  Follows all commands speech and language fluent.   Cranial nerve II-XII: Pupils were equal round reactive to light extraocular movements were full, visual field were full on confrontational test. Facial sensation and strength were normal. hearing was intact to finger rubbing bilaterally. Uvula tongue midline. head turning and shoulder shrug were normal and symmetric.Tongue protrusion into cheek strength was normal. Motor: normal bulk and tone, full strength in the BUE, BLE, Sensory: normal and symmetric to light touch,  Coordination: finger-nose-finger, heel-to-shin bilaterally, no dysmetria Gait and Station: Rising up from seated position without assistance, normal stance,  moderate stride, good arm swing, smooth turning, able to perform tiptoe, and heel walking without difficulty. Tandem gait is steady  DIAGNOSTIC DATA (LABS, IMAGING, TESTING) - ASSESSMENT AND PLAN  43 y.o. year old male  has a past medical history of Allergy,  GERD (gastroesophageal reflux disease), Hyperlipidemia, OSA (obstructive sleep apnea), Tobacco dependence, and Vitamin D deficiency. here with obstructive sleep apnea here for CPAP compliance.  Compliance data dated 03/20/2018-04/18/2018 shows compliance greater than 4 hours and 80% for 24 days.  Less than 4 hours at 17% for 5 days. Total  Usage 97% 29 out of 30 days.  Average usage 5 hours 34 minutes.  Set pressure 5 to 15 cm.  EPR level 3 leak 95th percentile 11.7 AHI 2.8     PLAN: CPAP compliance 80% >than 4 hours 17% < 4 hours for total compliance 97% continue same settings  reviewed data with patient follow-up yearly Dennie Bible, [facility], [facility], APRN  [facility] [address] [place], [place] [address] [phone]

## 2018-04-20 ENCOUNTER — Encounter: Payer: Self-pay | Admitting: Nurse Practitioner

## 2018-04-20 ENCOUNTER — Ambulatory Visit: Payer: BC Managed Care – PPO | Admitting: Nurse Practitioner

## 2018-04-20 VITALS — BP 105/69 | HR 64 | Ht 67.0 in | Wt 250.2 lb

## 2018-04-20 DIAGNOSIS — G4733 Obstructive sleep apnea (adult) (pediatric): Secondary | ICD-10-CM

## 2018-04-20 DIAGNOSIS — Z9989 Dependence on other enabling machines and devices: Secondary | ICD-10-CM

## 2018-04-20 NOTE — Patient Instructions (Signed)
CPAP compliance 80% >than 4 hours 17% < 4 hours for total compliance 97% continue same settings follow-up yearly

## 2018-06-24 ENCOUNTER — Ambulatory Visit: Payer: BC Managed Care – PPO | Admitting: Nurse Practitioner

## 2018-06-24 NOTE — Progress Notes (Deleted)
  Subjective:     Patient ID: Bruce Brooks , male    DOB: [birth year] , 43 y.o.   MRN: [id number]   No chief complaint on file.   HPI  HPI   Past Medical History:  Diagnosis Date  . Allergy    seasonal  . GERD (gastroesophageal reflux disease)   . Hyperlipidemia   . OSA (obstructive sleep apnea)   . Tobacco dependence   . Vitamin D deficiency      Family History  Problem Relation Age of Onset  . Colon polyps Father   . Colon cancer Neg Hx      Current Outpatient Medications:  .  ergocalciferol (VITAMIN D2) 1.25 MG (50000 UT) capsule, Take 1 capsule (50,000 Units total) by mouth once a week., Disp: 24 capsule, Rfl: 0 .  tadalafil (CIALIS) 10 MG tablet, Take 1 tablet (10 mg total) by mouth as needed for erectile dysfunction., Disp: 30 tablet, Rfl: 0   No Known Allergies   Review of Systems   There were no vitals filed for this visit. There is no height or weight on file to calculate BMI.   Objective:  Physical Exam      Assessment And Plan:     There are no diagnoses linked to this encounter.     ***  [name], FNP    THE PATIENT IS ENCOURAGED TO PRACTICE SOCIAL DISTANCING DUE TO THE COVID-19 PANDEMIC.

## 2018-11-16 ENCOUNTER — Encounter: Payer: Self-pay | Admitting: Nurse Practitioner

## 2018-11-16 ENCOUNTER — Ambulatory Visit (INDEPENDENT_AMBULATORY_CARE_PROVIDER_SITE_OTHER): Payer: BC Managed Care – PPO | Admitting: Nurse Practitioner

## 2018-11-16 ENCOUNTER — Telehealth: Payer: Self-pay

## 2018-11-16 ENCOUNTER — Other Ambulatory Visit: Payer: Self-pay

## 2018-11-16 VITALS — Ht 67.0 in | Wt 255.0 lb

## 2018-11-16 DIAGNOSIS — J029 Acute pharyngitis, unspecified: Secondary | ICD-10-CM

## 2018-11-16 DIAGNOSIS — Z1159 Encounter for screening for other viral diseases: Secondary | ICD-10-CM

## 2018-11-16 NOTE — Progress Notes (Signed)
Virtual Visit via Video   This visit type was conducted due to national recommendations for restrictions regarding the COVID-19 Pandemic (e.g. social distancing) in an effort to limit this patient's exposure and mitigate transmission in our community.  Due to his co-morbid illnesses, this patient is at least at moderate risk for complications without adequate follow up.  This format is felt to be most appropriate for this patient at this time.  All issues noted in this document were discussed and addressed.  A limited physical exam was performed with this format.    This visit type was conducted due to national recommendations for restrictions regarding the COVID-19 Pandemic (e.g. social distancing) in an effort to limit this patient's exposure and mitigate transmission in our community.  Patients identity confirmed using two different identifiers.  This format is felt to be most appropriate for this patient at this time.  All issues noted in this document were discussed and addressed.  No physical exam was performed (except for noted visual exam findings with Video Visits).    Date:  11/16/2018   ID:  Bruce Clines., DOB [birth year], MRN [id number]  Patient Location:  Home - spoke with Omer Jack  Provider location:   Office    Chief Complaint:  Having throat irritation  History of Present Illness:    Bruce Sosnowski. is a 43 y.o. male who presents via video conferencing for a telehealth visit today.    The patient does not have symptoms concerning for COVID-19 infection (fever, chills, cough, or new shortness of breath).   Uses a cpap nightly, symptoms are worse in the morning.  He does go out of the house to work approximately 20 hours.  No gatherings with any family members.   Sore Throat  This is a new problem. The current episode started 1 to 4 weeks ago (for the last 3 weeks). There has been no fever. Pertinent negatives include no abdominal pain,  congestion, coughing, ear pain, headaches, hoarse voice or shortness of breath.     Past Medical History:  Diagnosis Date  . Allergy    seasonal  . GERD (gastroesophageal reflux disease)   . Hyperlipidemia   . OSA (obstructive sleep apnea)   . Tobacco dependence   . Vitamin D deficiency    History reviewed. No pertinent surgical history.   Current Meds  Medication Sig  . tadalafil (CIALIS) 10 MG tablet Take 1 tablet (10 mg total) by mouth as needed for erectile dysfunction.     Allergies:   Patient has no known allergies.   Social History   Tobacco Use  . Smoking status: Current Every Day Smoker    Packs/day: 0.50  . Smokeless tobacco: Never Used  . Tobacco comment: smoking alot less now.  Substance Use Topics  . Alcohol use: Yes    Comment: occasional  . Drug use: No     Family Hx: The patient's family history includes Colon polyps in his father. There is no history of Colon cancer.  ROS:   Please see the history of present illness.    Review of Systems  Constitutional: Negative.   HENT: Positive for sore throat. Negative for congestion, ear pain, hoarse voice and sinus pain.   Respiratory: Negative for cough and shortness of breath.   Cardiovascular: Negative.   Gastrointestinal: Negative for abdominal pain.  Neurological: Negative for dizziness and headaches.    All other systems reviewed and are negative.   Labs/Other Tests and Data  Reviewed:    Recent Labs: No results found for requested labs within last 8760 hours.   Recent Lipid Panel No results found for: CHOL, TRIG, HDL, CHOLHDL, LDLCALC, LDLDIRECT  Wt Readings from Last 3 Encounters:  11/16/18 255 lb (115.7 kg)  04/20/18 250 lb 3.2 oz (113.5 kg)  04/14/18 248 lb 12.8 oz (112.9 kg)     Exam:    Vital Signs:  Ht 5\' 7"  (1.702 m)   Wt 255 lb (115.7 kg)   BMI 39.94 kg/m     Physical Exam  Constitutional: He is oriented to person, place, and time. No distress.  HENT:  Mouth/Throat:  Oropharynx is clear and moist.  No redness noted from viewing on video  Neurological: He is alert and oriented to person, place, and time.  Psychiatric: Mood, memory, affect and judgment normal.    ASSESSMENT & PLAN:    1. Sore throat  I will send him for coronavirus test at [facility] no known exposure  Encouraged to use salt water gargles as needed  He is advised to self isolate until his results come back  He is advised if he has shortness of breath to go to ER for further evaluation  This irritation may be related to his CPAP use however due to the current pandemic will evaluate for coronavirus. - Novel Coronavirus, NAA ([facility])   COVID-19 Education: The signs and symptoms of COVID-19 were discussed with the patient and how to seek care for testing (follow up with PCP or arrange E-visit).  The importance of social distancing was discussed today.  Patient Risk:   After full review of this patients clinical status, I feel that they are at least moderate risk at this time.  Time:   Today, I have spent 15 minutes/ seconds with the patient with telehealth technology discussing above diagnoses.     Medication Adjustments/Labs and Tests Ordered: Current medicines are reviewed at length with the patient today.  Concerns regarding medicines are outlined above.   Tests Ordered: Orders Placed This Encounter  Procedures  . Novel Coronavirus, NAA ([facility])    Medication Changes: No orders of the defined types were placed in this encounter.   Disposition:  Follow up prn  Signed, [name], FNP

## 2018-11-16 NOTE — Patient Instructions (Signed)

## 2018-11-16 NOTE — Telephone Encounter (Signed)
Patient called stating he has some had some throat irritation for the past couple of weeks and he would like an appointment.   I HAVE RETURNED PT CALL AND LEFT HIM A V/M TO CALL THE OFFICE SO WE CAN SCHEDULE HIM A VIRTUAL APPOINTMENT. Lonia Mad

## 2018-11-17 ENCOUNTER — Other Ambulatory Visit: Payer: Self-pay

## 2018-11-17 DIAGNOSIS — Z20822 Contact with and (suspected) exposure to covid-19: Secondary | ICD-10-CM

## 2018-11-19 LAB — NOVEL CORONAVIRUS, NAA: SARS-CoV-2, NAA: NOT DETECTED

## 2018-11-25 ENCOUNTER — Encounter: Payer: Self-pay | Admitting: Nurse Practitioner

## 2018-11-25 ENCOUNTER — Ambulatory Visit (INDEPENDENT_AMBULATORY_CARE_PROVIDER_SITE_OTHER): Payer: BC Managed Care – PPO | Admitting: Nurse Practitioner

## 2018-11-25 ENCOUNTER — Other Ambulatory Visit: Payer: Self-pay

## 2018-11-25 VITALS — BP 136/78 | HR 76 | Temp 98.0°F | Ht 68.8 in | Wt 265.0 lb

## 2018-11-25 DIAGNOSIS — Z23 Encounter for immunization: Secondary | ICD-10-CM

## 2018-11-25 DIAGNOSIS — F17200 Nicotine dependence, unspecified, uncomplicated: Secondary | ICD-10-CM

## 2018-11-25 DIAGNOSIS — R7989 Other specified abnormal findings of blood chemistry: Secondary | ICD-10-CM

## 2018-11-25 DIAGNOSIS — Z9989 Dependence on other enabling machines and devices: Secondary | ICD-10-CM

## 2018-11-25 DIAGNOSIS — Z716 Tobacco abuse counseling: Secondary | ICD-10-CM

## 2018-11-25 DIAGNOSIS — E669 Obesity, unspecified: Secondary | ICD-10-CM

## 2018-11-25 DIAGNOSIS — G4733 Obstructive sleep apnea (adult) (pediatric): Secondary | ICD-10-CM

## 2018-11-25 DIAGNOSIS — Z0000 Encounter for general adult medical examination without abnormal findings: Secondary | ICD-10-CM

## 2018-11-25 DIAGNOSIS — N529 Male erectile dysfunction, unspecified: Secondary | ICD-10-CM

## 2018-11-25 DIAGNOSIS — Z125 Encounter for screening for malignant neoplasm of prostate: Secondary | ICD-10-CM

## 2018-11-25 LAB — POCT URINALYSIS DIPSTICK
Bilirubin, UA: NEGATIVE
Glucose, UA: NEGATIVE
Ketones, UA: NEGATIVE
Leukocytes, UA: NEGATIVE
Nitrite, UA: NEGATIVE
Protein, UA: NEGATIVE
Spec Grav, UA: 1.025 (ref 1.010–1.025)
Urobilinogen, UA: 0.2 E.U./dL
pH, UA: 7 (ref 5.0–8.0)

## 2018-11-25 MED ORDER — TETANUS-DIPHTH-ACELL PERTUSSIS 5-2.5-18.5 LF-MCG/0.5 IM SUSP
0.5000 mL | Freq: Once | INTRAMUSCULAR | Status: AC
  Administered 2018-11-25: 12:00:00 0.5 mL via INTRAMUSCULAR

## 2018-11-25 NOTE — Patient Instructions (Signed)
Health Maintenance  Topic Date Due   INFLUENZA VACCINE  06/15/2019 (Originally 10/16/2018)   TETANUS/TDAP  11/24/2028   HIV Screening  Completed    Health Maintenance, Male Adopting a healthy lifestyle and getting preventive care are important in promoting health and wellness. Ask your health care provider about:  The right schedule for you to have regular tests and exams.  Things you can do on your own to prevent diseases and keep yourself healthy. What should I know about diet, weight, and exercise? Eat a healthy diet   Eat a diet that includes plenty of vegetables, fruits, low-fat dairy products, and lean protein.  Do not eat a lot of foods that are high in solid fats, added sugars, or sodium. Maintain a healthy weight Body mass index (BMI) is a measurement that can be used to identify possible weight problems. It estimates body fat based on height and weight. Your health care provider can help determine your BMI and help you achieve or maintain a healthy weight. Get regular exercise Get regular exercise. This is one of the most important things you can do for your health. Most adults should:  Exercise for at least 150 minutes each week. The exercise should increase your heart rate and make you sweat (moderate-intensity exercise).  Do strengthening exercises at least twice a week. This is in addition to the moderate-intensity exercise.  Spend less time sitting. Even light physical activity can be beneficial. Watch cholesterol and blood lipids Have your blood tested for lipids and cholesterol at 43 years of age, then have this test every 5 years. You may need to have your cholesterol levels checked more often if:  Your lipid or cholesterol levels are high.  You are older than 43 years of age.  You are at high risk for heart disease. What should I know about cancer screening? Many types of cancers can be detected early and may often be prevented. Depending on your health  history and family history, you may need to have cancer screening at various ages. This may include screening for:  Colorectal cancer.  Prostate cancer.  Skin cancer.  Lung cancer. What should I know about heart disease, diabetes, and high blood pressure? Blood pressure and heart disease  High blood pressure causes heart disease and increases the risk of stroke. This is more likely to develop in people who have high blood pressure readings, are of African descent, or are overweight.  Talk with your health care provider about your target blood pressure readings.  Have your blood pressure checked: ? Every 3-5 years if you are 9-30 years of age. ? Every year if you are 60 years old or older.  If you are between the ages of 70 and 48 and are a current or former smoker, ask your health care provider if you should have a one-time screening for abdominal aortic aneurysm (AAA). Diabetes Have regular diabetes screenings. This checks your fasting blood sugar level. Have the screening done:  Once every three years after age 53 if you are at a normal weight and have a low risk for diabetes.  More often and at a younger age if you are overweight or have a high risk for diabetes. What should I know about preventing infection? Hepatitis B If you have a higher risk for hepatitis B, you should be screened for this virus. Talk with your health care provider to find out if you are at risk for hepatitis B infection. Hepatitis C Blood testing is recommended  for:  Everyone born from [birth year] through 1965.  Anyone with known risk factors for hepatitis C. Sexually transmitted infections (STIs)  You should be screened each year for STIs, including gonorrhea and chlamydia, if: ? You are sexually active and are younger than 43 years of age. ? You are older than 43 years of age and your health care provider tells you that you are at risk for this type of infection. ? Your sexual activity has changed since  you were last screened, and you are at increased risk for chlamydia or gonorrhea. Ask your health care provider if you are at risk.  Ask your health care provider about whether you are at high risk for HIV. Your health care provider may recommend a prescription medicine to help prevent HIV infection. If you choose to take medicine to prevent HIV, you should first get tested for HIV. You should then be tested every 3 months for as long as you are taking the medicine. Follow these instructions at home: Lifestyle  Do not use any products that contain nicotine or tobacco, such as cigarettes, e-cigarettes, and chewing tobacco. If you need help quitting, ask your health care provider.  Do not use street drugs.  Do not share needles.  Ask your health care provider for help if you need support or information about quitting drugs. Alcohol use  Do not drink alcohol if your health care provider tells you not to drink.  If you drink alcohol: ? Limit how much you have to 0-2 drinks a day. ? Be aware of how much alcohol is in your drink. In the [place] one drink equals one 12 oz bottle of beer (355 mL), one 5 oz glass of wine (148 mL), or one 1 oz glass of hard liquor (44 mL). General instructions  Schedule regular health, dental, and eye exams.  Stay current with your vaccines.  Tell your health care provider if: ? You often feel depressed. ? You have ever been abused or do not feel safe at home. Summary  Adopting a healthy lifestyle and getting preventive care are important in promoting health and wellness.  Follow your health care provider's instructions about healthy diet, exercising, and getting tested or screened for diseases.  Follow your health care provider's instructions on monitoring your cholesterol and blood pressure. This information is not intended to replace advice given to you by your health care provider. Make sure you discuss any questions you have with your health care  provider. Document Released: 08/30/2007 Document Revised: 02/24/2018 Document Reviewed: 02/24/2018 Elsevier Patient Education  [place] (Tetanus and Diphtheria): What You Need to Know 1. Why get vaccinated? Tetanus  and diphtheria are very serious diseases. They are rare in the [place] today, but people who do become infected often have severe complications. Td vaccine is used to protect adolescents and adults from both of these diseases. Both tetanus and diphtheria are infections caused by bacteria. Diphtheria spreads from person to person through coughing or sneezing. Tetanus-causing bacteria enter the body through cuts, scratches, or wounds. TETANUS (Lockjaw) causes painful muscle tightening and stiffness, usually all over the body.  It can lead to tightening of muscles in the head and neck so you can't open your mouth, swallow, or sometimes even breathe. Tetanus kills about 1 out of every 10 people who are infected even after receiving the best medical care. DIPHTHERIA can cause a thick coating to form in the back of the throat.  It can lead  to breathing problems, paralysis, heart failure, and death. Before vaccines, as many as 200,000 cases of diphtheria and hundreds of cases of tetanus were reported in the [place] each year. Since vaccination began, reports of cases for both diseases have dropped by about 99%. 2. Td vaccine Td vaccine can protect adolescents and adults from tetanus and diphtheria. Td is usually given as a booster dose every 10 years but it can also be given earlier after a severe and dirty wound or burn. Another vaccine, called Tdap, which protects against pertussis in addition to tetanus and diphtheria, is sometimes recommended instead of Td vaccine. Your doctor or the person giving you the vaccine can give you more information. Td may safely be given at the same time as other vaccines. 3. Some people should not get this vaccine  A  person who has ever had a life-threatening allergic reaction after a previous dose of any tetanus or diphtheria containing vaccine, OR has a severe allergy to any part of this vaccine, should not get Td vaccine. Tell the person giving the vaccine about any severe allergies.  Talk to your doctor if you: ? had severe pain or swelling after any vaccine containing diphtheria or tetanus, ? ever had a condition called Guillain Barr Syndrome (GBS), ? aren't feeling well on the day the shot is scheduled. 4. Risks of a vaccine reaction With any medicine, including vaccines, there is a chance of side effects. These are usually mild and go away on their own. Serious reactions are also possible but are rare. Most people who get Td vaccine do not have any problems with it. Mild Problems following Td vaccine: (Did not interfere with activities)  Pain where the shot was given (about 8 people in 10)  Redness or swelling where the shot was given (about 1 person in 4)  Mild fever (rare)  Headache (about 1 person in 4)  Tiredness (about 1 person in 4) Moderate Problems following Td vaccine: (Interfered with activities, but did not require medical attention)  Fever over 102F (rare) Severe Problems following Td vaccine: (Unable to perform usual activities; required medical attention)  Swelling, severe pain, bleeding and/or redness in the arm where the shot was given (rare). Problems that could happen after any vaccine:  People sometimes faint after a medical procedure, including vaccination. Sitting or lying down for about 15 minutes can help prevent fainting, and injuries caused by a fall. Tell your doctor if you feel dizzy, or have vision changes or ringing in the ears.  Some people get severe pain in the shoulder and have difficulty moving the arm where a shot was given. This happens very rarely.  Any medication can cause a severe allergic reaction. Such reactions from a vaccine are very rare,  estimated at fewer than 1 in a million doses, and would happen within a few minutes to a few hours after the vaccination. As with any medicine, there is a very remote chance of a vaccine causing a serious injury or death. The safety of vaccines is always being monitored. For more information, visit: http://www.aguilar.org/ 5. What if there is a serious reaction? What should I look for?  Look for anything that concerns you, such as signs of a severe allergic reaction, very high fever, or unusual behavior. Signs of a severe allergic reaction can include hives, swelling of the face and throat, difficulty breathing, a fast heartbeat, dizziness, and weakness. These would usually start a few minutes to a few hours after the vaccination.  What should I do?  If you think it is a severe allergic reaction or other emergency that can't wait, call 9-1-1 or get the person to the nearest hospital. Otherwise, call your doctor.  Afterward, the reaction should be reported to the Vaccine Adverse Event Reporting System (VAERS). Your doctor might file this report, or you can do it yourself through the VAERS web site at www.vaers.SamedayNews.es, or by calling 319-819-0018. VAERS does not give medical advice. 6. The National Vaccine Injury Compensation Program The [employer] Vaccine Injury Compensation Program (VICP) is a federal program that was created to compensate people who may have been injured by certain vaccines. Persons who believe they may have been injured by a vaccine can learn about the program and about filing a claim by calling 512-545-0077 or visiting the [employer] website at GoldCloset.com.ee. There is a time limit to file a claim for compensation. 7. How can I learn more?  Ask your doctor. He or she can give you the vaccine package insert or suggest other sources of information.  Call your local or state health department.  Contact the Centers for Disease Control and Prevention  (CDC): ? Call (618)537-5324 (1-800-CDC-INFO) ? Visit CDC's website at http://hunter.com/ Vaccine Information Statement Td Vaccine (06/26/15) This information is not intended to replace advice given to you by your health care provider. Make sure you discuss any questions you have with your health care provider. Document Released: 12/29/2005 Document Revised: 10/19/2017 Document Reviewed: 10/19/2017 Elsevier Interactive Patient Education  2020 [employer].   Obesity, Adult Obesity is having too much body fat. Being obese means that your weight is more than what is healthy for you. BMI is a number that explains how much body fat you have. If you have a BMI of 30 or more, you are obese. Obesity is often caused by eating or drinking more calories than your body uses. Changing your lifestyle can help you lose weight. Obesity can cause serious health problems, such as:  Stroke.  Coronary artery disease (CAD).  Type 2 diabetes.  Some types of cancer, including cancers of the colon, breast, uterus, and gallbladder.  Osteoarthritis.  High blood pressure (hypertension).  High cholesterol.  Sleep apnea.  Gallbladder stones.  Infertility problems. What are the causes?  Eating meals each day that are high in calories, sugar, and fat.  Being born with genes that may make you more likely to become obese.  Having a medical condition that causes obesity.  Taking certain medicines.  Sitting a lot (having a sedentary lifestyle).  Not getting enough sleep.  Drinking a lot of drinks that have sugar in them. What increases the risk?  Having a family history of obesity.  Being an [place] American woman.  Being a Hispanic man.  Living in an area with limited access to: ? Romilda Garret, recreation centers, or sidewalks. ? Healthy food choices, such as grocery stores and farmers' markets. What are the signs or symptoms? The main sign is having too much body fat. How is this  treated?  Treatment for this condition often includes changing your lifestyle. Treatment may include: ? Changing your diet. This may include making a healthy meal plan. ? Exercise. This may include activity that causes your heart to beat faster (aerobic exercise) and strength training. Work with your doctor to design a program that works for you. ? Medicine to help you lose weight. This may be used if you are not able to lose 1 pound a week after 6 weeks of healthy eating  and more exercise. ? Treating conditions that cause the obesity. ? Surgery. Options may include gastric banding and gastric bypass. This may be done if:  Other treatments have not helped to improve your condition.  You have a BMI of 40 or higher.  You have life-threatening health problems related to obesity. Follow these instructions at home: Eating and drinking   Follow advice from your doctor about what to eat and drink. Your doctor may tell you to: ? Limit fast food, sweets, and processed snack foods. ? Choose low-fat options. For example, choose low-fat milk instead of whole milk. ? Eat 5 or more servings of fruits or vegetables each day. ? Eat at home more often. This gives you more control over what you eat. ? Choose healthy foods when you eat out. ? Learn to read food labels. This will help you learn how much food is in 1 serving. ? Keep low-fat snacks available. ? Avoid drinks that have a lot of sugar in them. These include soda, fruit juice, iced tea with sugar, and flavored milk.  Drink enough water to keep your pee (urine) pale yellow.  Do not go on fad diets. Physical activity  Exercise often, as told by your doctor. Most adults should get up to 150 minutes of moderate-intensity exercise every week.Ask your doctor: ? What types of exercise are safe for you. ? How often you should exercise.  Warm up and stretch before being active.  Do slow stretching after being active (cool down).  Rest between  times of being active. Lifestyle  Work with your doctor and a food expert (dietitian) to set a weight-loss goal that is best for you.  Limit your screen time.  Find ways to reward yourself that do not involve food.  Do not drink alcohol if: ? Your doctor tells you not to drink. ? You are pregnant, may be pregnant, or are planning to become pregnant.  If you drink alcohol: ? Limit how much you use to:  0-1 drink a day for women.  0-2 drinks a day for men. ? Be aware of how much alcohol is in your drink. In the [place] one drink equals one 12 oz bottle of beer (355 mL), one 5 oz glass of wine (148 mL), or one 1 oz glass of hard liquor (44 mL). General instructions  Keep a weight-loss journal. This can help you keep track of: ? The food that you eat. ? How much exercise you get.  Take over-the-counter and prescription medicines only as told by your doctor.  Take vitamins and supplements only as told by your doctor.  Think about joining a support group.  Keep all follow-up visits as told by your doctor. This is important. Contact a doctor if:  You cannot meet your weight loss goal after you have changed your diet and lifestyle for 6 weeks. Get help right away if you:  Are having trouble breathing.  Are having thoughts of harming yourself. Summary  Obesity is having too much body fat.  Being obese means that your weight is more than what is healthy for you.  Work with your doctor to set a weight-loss goal.  Get regular exercise as told by your doctor. This information is not intended to replace advice given to you by your health care provider. Make sure you discuss any questions you have with your health care provider. Document Released: 05/26/2011 Document Revised: 11/05/2017 Document Reviewed: 11/05/2017 Elsevier Patient Education  2020 [employer].

## 2018-11-25 NOTE — Progress Notes (Signed)
Subjective:     Patient ID: Bruce Brooks , male    DOB: Feb 12, 1976 , 43 y.o.   MRN: [id number]   Chief Complaint  Patient presents with  . Annual Exam   Men's preventive visit. Patient Health Questionnaire (PHQ-2) is    Office Visit from 11/25/2018 in [facility]  PHQ-2 Total Score  0     Patient is on a regular diet, limited amount of vegetables.  Eats increased amounts of starch.  Marital status: Single. Relevant history for alcohol use is:  Social History   Substance and Sexual Activity  Alcohol Use Yes   Comment: occasional   Relevant history for tobacco use is:  Social History   Tobacco Use  Smoking Status Current Every Day Smoker  . Packs/day: 0.50  Smokeless Tobacco Never Used  Tobacco Comment   smoking alot less now.   HPI  Here for HM   Wt Readings from Last 3 Encounters: 11/25/18 : 265 lb (120.2 kg) 11/16/18 : 255 lb (115.7 kg) 04/20/18 : 250 lb 3.2 oz (113.5 kg)     Past Medical History:  Diagnosis Date  . Allergy    seasonal  . GERD (gastroesophageal reflux disease)   . Hyperlipidemia   . OSA (obstructive sleep apnea)   . Tobacco dependence   . Vitamin D deficiency      Family History  Problem Relation Age of Onset  . Colon polyps Father   . Colon cancer Neg Hx      Current Outpatient Medications:  .  tadalafil (CIALIS) 10 MG tablet, Take 1 tablet (10 mg total) by mouth as needed for erectile dysfunction., Disp: 30 tablet, Rfl: 0   No Known Allergies   Review of Systems  Constitutional: Negative.   HENT: Negative.   Eyes: Negative.   Respiratory: Negative.   Cardiovascular: Negative.   Gastrointestinal: Negative.   Endocrine: Negative.   Genitourinary: Negative.   Musculoskeletal: Negative.   Skin: Negative.   Allergic/Immunologic: Negative.   Neurological: Negative.   Hematological: Negative.   Psychiatric/Behavioral: Negative.      Today's Vitals   11/25/18 1111  BP: 136/78  Pulse: 76   Temp: 98 F (36.7 C)  TempSrc: Oral  Weight: 265 lb (120.2 kg)  Height: 5' 8.8" (1.748 m)  PainSc: 0-No pain   Body mass index is 39.36 kg/m.   Objective:  Physical Exam Constitutional:      Appearance: Normal appearance.  HENT:     Head: Normocephalic.     Right Ear: Tympanic membrane, ear canal and external ear normal. There is no impacted cerumen.     Left Ear: Tympanic membrane, ear canal and external ear normal. There is no impacted cerumen.  Cardiovascular:     Rate and Rhythm: Normal rate and regular rhythm.     Pulses: Normal pulses.     Heart sounds: Normal heart sounds. No murmur.  Pulmonary:     Effort: Pulmonary effort is normal.     Breath sounds: Normal breath sounds.  Abdominal:     General: Abdomen is flat. Bowel sounds are normal.     Palpations: Abdomen is soft. There is no mass.     Hernia: No hernia is present.  Skin:    Capillary Refill: Capillary refill takes less than 2 seconds.  Neurological:     General: No focal deficit present.     Mental Status: He is alert.  Psychiatric:        Mood and  Affect: Mood normal.        Behavior: Behavior normal.        Thought Content: Thought content normal.        Judgment: Judgment normal.         Assessment And Plan:     1. Encounter for general adult medical examination w/o abnormal findings . Behavior modifications discussed and diet history reviewed.   . Pt will continue to exercise regularly and modify diet with low GI, plant based foods and decrease intake of processed foods.  . Recommend intake of daily multivitamin, Vitamin D, and calcium.  . Recommend for preventive screenings, as well as recommend immunizations that include influenza, TDAP - POCT Urinalysis Dipstick (81002) - HIV antibody (with reflex) - Tdap (BOOSTRIX) injection 0.5 mL - BMP8+eGFR - CBC no Diff  2. Tobacco use disorder  Smoking cessation instruction/counseling given:  counseled patient on the dangers of tobacco use,  advised patient to stop smoking, and reviewed strategies to maximize success  3. Tobacco abuse counseling  He is not interested in quitting smoking at this time  4. Obesity (BMI 35.0-39.9 without comorbidity)  Chronic  Discussed healthy diet and regular exercise options   Encouraged to exercise at least 150 minutes per week with 2 days of strength training - Hemoglobin A1c  5. Encounter for immunization   tetanus vaccine was given today while in office. TDAP will be administered to adults 36-8 years old every 10 years. - Tdap (BOOSTRIX) injection 0.5 mL  6. Obstructive sleep apnea treated with continuous positive airway pressure (CPAP)  Chronic, stable  Continue with follow up with Neurology  7. Low testosterone in male  Will rerefer to urology, he does not feel they called him previously  - Ambulatory referral to Urology  8. Erectile dysfunction, unspecified erectile dysfunction type  He did not follow up from the last referral however he continues to complain of erectile dysfunction - Ambulatory referral to Urology  9. Encounter for prostate cancer screening  - PSA    [name], FNP    THE PATIENT IS ENCOURAGED TO PRACTICE SOCIAL DISTANCING DUE TO THE COVID-19 PANDEMIC.

## 2018-11-26 ENCOUNTER — Encounter: Payer: BC Managed Care – PPO | Admitting: Nurse Practitioner

## 2018-11-26 LAB — CBC
Hematocrit: 39.4 % (ref 37.5–51.0)
Hemoglobin: 13.6 g/dL (ref 13.0–17.7)
MCH: 28.9 pg (ref 26.6–33.0)
MCHC: 34.5 g/dL (ref 31.5–35.7)
MCV: 84 fL (ref 79–97)
Platelets: 254 10*3/uL (ref 150–450)
RBC: 4.7 x10E6/uL (ref 4.14–5.80)
RDW: 13.7 % (ref 11.6–15.4)
WBC: 6.9 10*3/uL (ref 3.4–10.8)

## 2018-11-26 LAB — HEMOGLOBIN A1C
Est. average glucose Bld gHb Est-mCnc: 120 mg/dL
Hgb A1c MFr Bld: 5.8 % — ABNORMAL HIGH (ref 4.8–5.6)

## 2018-11-26 LAB — BMP8+EGFR
BUN/Creatinine Ratio: 10 (ref 9–20)
BUN: 10 mg/dL (ref 6–24)
CO2: 22 mmol/L (ref 20–29)
Calcium: 9.4 mg/dL (ref 8.7–10.2)
Chloride: 106 mmol/L (ref 96–106)
Creatinine, Ser: 1 mg/dL (ref 0.76–1.27)
GFR calc Af Amer: 106 mL/min/{1.73_m2} (ref >59)
GFR calc non Af Amer: 92 mL/min/{1.73_m2} (ref >59)
Glucose: 78 mg/dL (ref 65–99)
Potassium: 4 mmol/L (ref 3.5–5.2)
Sodium: 139 mmol/L (ref 134–144)

## 2018-11-26 LAB — HIV ANTIBODY (ROUTINE TESTING W REFLEX): HIV Screen 4th Generation wRfx: NONREACTIVE

## 2018-11-26 LAB — PSA: Prostate Specific Ag, Serum: 0.8 ng/mL (ref 0.0–4.0)

## 2018-11-26 LAB — TSH: TSH: 1.91 u[IU]/mL (ref 0.450–4.500)

## 2019-03-28 ENCOUNTER — Encounter: Payer: Self-pay | Admitting: Nurse Practitioner

## 2019-03-28 ENCOUNTER — Other Ambulatory Visit: Payer: Self-pay

## 2019-03-28 ENCOUNTER — Ambulatory Visit (INDEPENDENT_AMBULATORY_CARE_PROVIDER_SITE_OTHER): Payer: BC Managed Care – PPO | Admitting: Nurse Practitioner

## 2019-03-28 VITALS — BP 132/80 | HR 63 | Temp 98.4°F | Ht 67.6 in | Wt 258.6 lb

## 2019-03-28 DIAGNOSIS — N6321 Unspecified lump in the left breast, upper outer quadrant: Secondary | ICD-10-CM | POA: Diagnosis not present

## 2019-03-28 DIAGNOSIS — N644 Mastodynia: Secondary | ICD-10-CM

## 2019-03-28 DIAGNOSIS — Z803 Family history of malignant neoplasm of breast: Secondary | ICD-10-CM

## 2019-03-28 MED ORDER — CEPHALEXIN 500 MG PO CAPS: 500.0000 mg | ORAL_CAPSULE | Freq: Four times a day (QID) | ORAL | 0 refills | Status: AC

## 2019-03-28 NOTE — Progress Notes (Signed)
This visit occurred during the SARS-CoV-2 public health emergency.  Safety protocols were in place, including screening questions prior to the visit, additional usage of staff PPE, and extensive cleaning of exam room while observing appropriate contact time as indicated for disinfecting solutions.  Subjective:     Patient ID: Bruce Brooks , male    DOB: 1976/02/20 , 44 y.o.   MRN: [id number]   Chief Complaint  Patient presents with  . Mass    patient stated he has a lump on his chest. patient stated the lump is tender to the touch he noticed it about 3 weeks ago     HPI  Tenderness under left breast nipple and feels like there is a lump.  His mother currently has breast cancer.  No nipple discharge.   Denies fever.      Past Medical History:  Diagnosis Date  . Allergy    seasonal  . GERD (gastroesophageal reflux disease)   . Hyperlipidemia   . OSA (obstructive sleep apnea)   . Tobacco dependence   . Vitamin D deficiency      Family History  Problem Relation Age of Onset  . Colon polyps Father   . Colon cancer Neg Hx      Current Outpatient Medications:  .  tadalafil (CIALIS) 10 MG tablet, Take 1 tablet (10 mg total) by mouth as needed for erectile dysfunction., Disp: 30 tablet, Rfl: 0 .  varenicline (CHANTIX PAK) 0.5 MG X 11 & 1 MG X 42 tablet, Take by mouth 2 (two) times daily. Take one 0.5 mg tablet by mouth once daily for 3 days, then increase to one 0.5 mg tablet twice daily for 4 days, then increase to one 1 mg tablet twice daily., Disp: , Rfl:    No Known Allergies   Review of Systems  Constitutional: Negative.   Respiratory: Negative.   Cardiovascular: Negative.  Negative for chest pain, palpitations and leg swelling.  Skin:       Tenderness to left breast nipple.    Psychiatric/Behavioral: Negative.      Today's Vitals   03/28/19 1116  BP: 132/80  Pulse: 63  Temp: 98.4 F (36.9 C)  TempSrc: Oral  Weight: 258 lb 9.6 oz (117.3 kg)  Height:  5' 7.6" (1.717 m)  PainSc: 0-No pain   Body mass index is 39.79 kg/m.   Objective:  Physical Exam Constitutional:      General: He is not in acute distress.    Appearance: Normal appearance. He is obese.  Cardiovascular:     Rate and Rhythm: Normal rate and regular rhythm.     Pulses: Normal pulses.     Heart sounds: Normal heart sounds. No murmur.  Pulmonary:     Effort: Pulmonary effort is normal. No respiratory distress.     Breath sounds: Normal breath sounds.  Chest:     Breasts:        Right: Normal. No nipple discharge or tenderness.        Left: Tenderness (left breast ) present. No nipple discharge.    Skin:    Capillary Refill: Capillary refill takes less than 2 seconds.  Neurological:     General: No focal deficit present.     Mental Status: He is alert and oriented to person, place, and time.     Cranial Nerves: No cranial nerve deficit.  Psychiatric:        Mood and Affect: Mood normal.  Behavior: Behavior normal.        Thought Content: Thought content normal.        Judgment: Judgment normal.         Assessment And Plan:     1. Breast tenderness in male  Left breast tenderness and soft lump palpated near nipple at 2 o'clock  I will treat with an antibiotic in the event this is an abscess  - [place] CHEST SOFT TISSUE; Future - cephALEXin (KEFLEX) 500 MG capsule; Take 1 capsule (500 mg total) by mouth 4 (four) times daily for 10 days.  Dispense: 40 capsule; Refill: 0  2. Family history of breast cancer  His mother currently has breast cancer - [place] CHEST SOFT TISSUE; Future   [name], FNP    THE PATIENT IS ENCOURAGED TO PRACTICE SOCIAL DISTANCING DUE TO THE COVID-19 PANDEMIC.

## 2019-04-19 ENCOUNTER — Other Ambulatory Visit: Payer: Self-pay

## 2019-04-19 MED ORDER — VARENICLINE TARTRATE 1 MG PO TABS: 1.0000 mg | ORAL_TABLET | Freq: Two times a day (BID) | ORAL | 2 refills | Status: DC

## 2019-04-19 NOTE — Progress Notes (Signed)
ch

## 2019-04-26 ENCOUNTER — Other Ambulatory Visit: Payer: Self-pay

## 2019-04-26 ENCOUNTER — Ambulatory Visit: Payer: BC Managed Care – PPO | Admitting: Neurology

## 2019-04-26 ENCOUNTER — Other Ambulatory Visit: Payer: Self-pay | Admitting: Nurse Practitioner

## 2019-04-26 ENCOUNTER — Encounter: Payer: Self-pay | Admitting: Neurology

## 2019-04-26 VITALS — BP 121/65 | HR 78 | Temp 97.8°F | Ht 67.0 in | Wt 256.4 lb

## 2019-04-26 DIAGNOSIS — Z9989 Dependence on other enabling machines and devices: Secondary | ICD-10-CM | POA: Diagnosis not present

## 2019-04-26 DIAGNOSIS — G4733 Obstructive sleep apnea (adult) (pediatric): Secondary | ICD-10-CM | POA: Diagnosis not present

## 2019-04-26 NOTE — Progress Notes (Signed)
PATIENT: Bruce Brooks. DOB: 1975/08/14  REASON FOR VISIT: follow up HISTORY FROM: patient  HISTORY OF PRESENT ILLNESS: Today 04/26/19  Mr. Bruce Brooks is a 44 year old male with history of obstructive sleep apnea, on CPAP.  Compliance download from 03/26/2019-04/24/2019 was reviewed.  Download reveals usage 30/30 days, greater than 4 hours 80% (24 days), less than 4 hours 20% (6 days).  Average usage 5 hours 42 minutes.  Settings show minimum pressure 5 cmH2O, maximum pressure 15 cmH2O.  Leak in the 95 percentile was 17.0, AHI was 2.9.  His equipment is working well, he has necessary supplies.  If he does not wear his CPAP, is because he fell asleep on the couch.  He can tell a difference when he does not wear CPAP, feels groggy the next day.  His daytime sleepiness is much improved with CPAP.  His supplier is [employer].  He works as a Licensed conveyancer at [employer].  He got engaged this year.  He denies any new problems or concerns.  He presents today for follow-up.  ESS was 7.  HISTORY  HISTORY OF PRESENT ILLNESS:UPDATE 2/4/2020CM Mr. Write, 44 year old male returns for follow-up with history of obstructive sleep apnea here for CPAP compliance.  He has less daytime sleepiness on CPAP.  Only wakes up once during the night to go to the restroom.  Compliance data dated 03/20/2018-04/18/2018 shows compliance greater than 4 hours and 80% for 24 days.  Less than 4 hours at 17% for 5 days. Total  Usage 97% 29 out of 30 days.  Average usage 5 hours 34 minutes.  Set pressure 5 to 15 cm.  EPR level 3 leak 95th percentile 11.7 AHI 2.8 he returns for reevaluation  UPDATE 9/25/2019CM Mr. Bruce Brooks, 44 year old male returns for follow-up with history of newly diagnosed obstructive sleep apnea here for his second CPAP compliance. Patient states when he uses his CPAP he feels much better more well rested.  Compliance data dated 11/08/2017 to 12/07/2017 shows greater than 4 hours at 17%.  Average usage 4 hours 26 minutes  set pressure 5 to 15 cm.  EPR level 3 leak 95th percentile 19.5 AHI 5.1.  Is excuse for not being more compliant is that he works a lot of hours and he is so exhausted when he gets home he forgets to put his machine on.  He returns for reevaluation   UPDATE 5/23/2019CM Mr. Bruce Brooks, 44 year old male returns for follow-up with newly diagnosed obstructive sleep apnea here for initial CPAP compliance.  He claims he is not having any trouble with his machine he has had some allergy issues and due to the stuffiness has not always used it at night.  Compliance data dated 05/07/2017-08/04/2017 shows compliance greater than 4 hours for 26 days out of 90 at 29%.  Average usage 4 hours 10 minutes.  Set pressure 5 to 15 years.  EPR level 3 AHI 5.7.  ESS 7.  He returns for reevaluation 10/15/18CDFranklin C Robinsonis a 44 y.o.male, seen here as in a referralfrom Dr. Baird Cancer and NP JaneceMoorefor a sleep apnea evaluation, after being told he snores and stops to breath at night in his sleep.Chief complaint according to patient :hypersomnia/ fatigue.  Mr. Bruce Brooks is a 44 year old African-American right-handed male patient, presenting with a feeling of increased sleepiness and fatigueindaytime, whichhas slowly evolved over several years. He has changed his lifestyle in several ways, has adjusted his diet and has implemented a more active lifestyle. He is in the process of reducing his  tobacco use and endorsed now 6 cigarettes a day on average. He very rarely drinks alcohol, he drinks coffee during daytime mostly in the morning about16ounces daily.  Sleep habits are as follows:He goes to bed between 10-11 PM, after a late dinner at 9.30 PM.The patient usually sleeps on his side, or falls asleep on her side with 2 pillows for head support.He shares the bedroom with a male partner.He describes his bedroom as cool, quiet and dark. He has been told that he snores but he has never woken himself up from  snoring. He has 2-3 time nocturia( 2.00- 4.00- finally at 6.30 AM). Rises between 6.30 and 7 AM. He uses an alarm for backup, but is usually spontaneously awake at the time he needs to rise. He does not feel as refreshed and restored as he used to. He denies having a dry mouth, palpitations, nausea,dizziness or headaches/pain. He endorsed joint stiffness. He estimates ( through fit bit ) 4-6 hours of sleep each night  REVIEW OF SYSTEMS: Out of a complete 14 system review of symptoms, the patient complains only of the following symptoms, and all other reviewed systems are negative.  Sleep apnea  ALLERGIES: No Known Allergies  HOME MEDICATIONS: Outpatient Medications Prior to Visit  Medication Sig Dispense Refill  . tadalafil (CIALIS) 10 MG tablet Take 1 tablet (10 mg total) by mouth as needed for erectile dysfunction. 30 tablet 0  . varenicline (CHANTIX CONTINUING MONTH PAK) 1 MG tablet Take 1 tablet (1 mg total) by mouth 2 (two) times daily. 60 tablet 2   No facility-administered medications prior to visit.    PAST MEDICAL HISTORY: Past Medical History:  Diagnosis Date  . Allergy    seasonal  . GERD (gastroesophageal reflux disease)   . Hyperlipidemia   . OSA (obstructive sleep apnea)   . Tobacco dependence   . Vitamin D deficiency     PAST SURGICAL HISTORY: History reviewed. No pertinent surgical history.  FAMILY HISTORY: Family History  Problem Relation Age of Onset  . Colon polyps Father   . Colon cancer Neg Hx     SOCIAL HISTORY: Social History   Socioeconomic History  . Marital status: Single    Spouse name: Not on file  . Number of children: Not on file  . Years of education: Not on file  . Highest education level: Not on file  Occupational History  . Not on file  Tobacco Use  . Smoking status: Current Every Day Smoker    Packs/day: 0.50  . Smokeless tobacco: Never Used  . Tobacco comment: smokes 7-8 cigarettes - just started chantix - he had a smoking  cessation course  Substance and Sexual Activity  . Alcohol use: Yes    Comment: occasional  . Drug use: No  . Sexual activity: Not on file  Other Topics Concern  . Not on file  Social History Narrative  . Not on file   Social Determinants of Health   Financial Resource Strain:   . Difficulty of Paying Living Expenses: Not on file  Food Insecurity:   . Worried About Charity fundraiser in the Last Year: Not on file  . Ran Out of Food in the Last Year: Not on file  Transportation Needs:   . Lack of Transportation (Medical): Not on file  . Lack of Transportation (Non-Medical): Not on file  Physical Activity:   . Days of Exercise per Week: Not on file  . Minutes of Exercise per Session: Not on  file  Stress:   . Feeling of Stress : Not on file  Social Connections:   . Frequency of Communication with Friends and Family: Not on file  . Frequency of Social Gatherings with Friends and Family: Not on file  . Attends Religious Services: Not on file  . Active Member of Clubs or Organizations: Not on file  . Attends Archivist Meetings: Not on file  . Marital Status: Not on file  Intimate Partner Violence:   . Fear of Current or Ex-Partner: Not on file  . Emotionally Abused: Not on file  . Physically Abused: Not on file  . Sexually Abused: Not on file   PHYSICAL EXAM  Vitals:   04/26/19 0950  BP: 121/65  Pulse: 78  Temp: 97.8 F (36.6 C)  TempSrc: Oral  Weight: 256 lb 6.4 oz (116.3 kg)  Height: 5\' 7"  (1.702 m)   Body mass index is 40.16 kg/m.  Generalized: Well developed, in no acute distress   Neurological examination  Mentation: Alert oriented to time, place, history taking. Follows all commands speech and language fluent Cranial nerve II-XII: Pupils were equal round reactive to light. Extraocular movements were full, visual field were full on confrontational test. Facial sensation and strength were normal. Head turning and shoulder shrug were normal and  symmetric. Motor: The motor testing reveals 5 over 5 strength of all 4 extremities. Good symmetric motor tone is noted throughout.  Sensory: Sensory testing is intact to soft touch on all 4 extremities. No evidence of extinction is noted.  Coordination: Cerebellar testing reveals good finger-nose-finger and heel-to-shin bilaterally.  Gait and station: Gait is normal. Tandem gait is mildly unsteady. Reflexes: Deep tendon reflexes are symmetric and normal bilaterally.   DIAGNOSTIC DATA (LABS, IMAGING, TESTING) - I reviewed patient records, labs, notes, testing and imaging myself where available.  Lab Results  Component Value Date   WBC 6.9 11/25/2018   HGB 13.6 11/25/2018   HCT 39.4 11/25/2018   MCV 84 11/25/2018   PLT 254 11/25/2018      Component Value Date/Time   NA 139 11/25/2018 1220   K 4.0 11/25/2018 1220   CL 106 11/25/2018 1220   CO2 22 11/25/2018 1220   GLUCOSE 78 11/25/2018 1220   BUN 10 11/25/2018 1220   CREATININE 1.00 11/25/2018 1220   CALCIUM 9.4 11/25/2018 1220   GFRNONAA 92 11/25/2018 1220   GFRAA 106 11/25/2018 1220   No results found for: CHOL, HDL, LDLCALC, LDLDIRECT, TRIG, CHOLHDL Lab Results  Component Value Date   HGBA1C 5.8 (H) 11/25/2018   No results found for: VITAMINB12 Lab Results  Component Value Date   TSH 1.910 11/25/2018   ASSESSMENT AND PLAN 44 y.o. year old male  has a past medical history of Allergy, GERD (gastroesophageal reflux disease), Hyperlipidemia, OSA (obstructive sleep apnea), Tobacco dependence, and Vitamin D deficiency. here with:  1.  Obstructive sleep apnea, on CPAP  Overall, his CPAP download shows good compliance, 100% use of the last 30 days, greater than 4 hours, 80% of the time, less than 4 hours 20% of the time.  He will continue with same settings.  We reviewed the data, encouraged him to use his CPAP for greater than 4 hours every night.  He feels better with CPAP.  His leak in the 95th percentile was 17, if he  notices leaking he will let me know, I can order mask refitted.  He will follow-up in 1 year or sooner if needed.  I  spent 15 minutes with the patient. 50% of this time was spent discussing the plan of care and reviewing his CPAP download.   Butler Denmark, AGNP-C, DNP 04/26/2019, 10:13 AM [facility] [address] [place], [place] [address] [phone]

## 2019-04-26 NOTE — Patient Instructions (Signed)
It was great to see you! Congrats on your engagement! Continue using your CPAP every night for > 4 hours. See you back in 1 year.

## 2019-04-27 ENCOUNTER — Other Ambulatory Visit: Payer: Self-pay | Admitting: Nurse Practitioner

## 2019-04-27 DIAGNOSIS — N644 Mastodynia: Secondary | ICD-10-CM

## 2019-05-11 ENCOUNTER — Ambulatory Visit
Admission: RE | Admit: 2019-05-11 | Discharge: 2019-05-11 | Disposition: A | Discharge status: Home / Self Care | Payer: BC Managed Care – PPO | Source: Ambulatory Visit | Attending: Nurse Practitioner | Admitting: Nurse Practitioner

## 2019-05-11 ENCOUNTER — Other Ambulatory Visit: Payer: Self-pay

## 2019-05-11 DIAGNOSIS — N644 Mastodynia: Secondary | ICD-10-CM

## 2019-05-25 ENCOUNTER — Ambulatory Visit: Payer: BC Managed Care – PPO | Admitting: Nurse Practitioner

## 2019-10-10 ENCOUNTER — Emergency Department (HOSPITAL_COMMUNITY): Payer: BC Managed Care – PPO

## 2019-10-10 ENCOUNTER — Encounter (HOSPITAL_COMMUNITY): Payer: Self-pay

## 2019-10-10 ENCOUNTER — Other Ambulatory Visit: Payer: Self-pay

## 2019-10-10 DIAGNOSIS — R519 Headache, unspecified: Secondary | ICD-10-CM | POA: Diagnosis not present

## 2019-10-10 DIAGNOSIS — Y939 Activity, unspecified: Secondary | ICD-10-CM | POA: Insufficient documentation

## 2019-10-10 DIAGNOSIS — R0781 Pleurodynia: Secondary | ICD-10-CM | POA: Diagnosis not present

## 2019-10-10 DIAGNOSIS — Y999 Unspecified external cause status: Secondary | ICD-10-CM | POA: Diagnosis not present

## 2019-10-10 DIAGNOSIS — Z5321 Procedure and treatment not carried out due to patient leaving prior to being seen by health care provider: Secondary | ICD-10-CM | POA: Insufficient documentation

## 2019-10-10 DIAGNOSIS — Y92410 Unspecified street and highway as the place of occurrence of the external cause: Secondary | ICD-10-CM | POA: Diagnosis not present
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
## 2019-10-11 ENCOUNTER — Telehealth: Payer: Self-pay

## 2019-10-11 ENCOUNTER — Emergency Department (HOSPITAL_COMMUNITY)
Admission: EM | Admit: 2019-10-11 | Discharge: 2019-10-11 | Disposition: A | Discharge status: Home / Self Care | Payer: BC Managed Care – PPO | Attending: Emergency Medicine | Admitting: Emergency Medicine
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
## 2019-10-26 ENCOUNTER — Other Ambulatory Visit: Payer: Self-pay | Admitting: Nurse Practitioner

## 2019-11-30 NOTE — Patient Instructions (Signed)
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
## 2019-12-01 ENCOUNTER — Other Ambulatory Visit: Payer: Self-pay

## 2019-12-01 ENCOUNTER — Encounter: Payer: Self-pay | Admitting: Nurse Practitioner

## 2019-12-01 ENCOUNTER — Ambulatory Visit: Payer: BC Managed Care – PPO | Admitting: Nurse Practitioner

## 2019-12-01 VITALS — BP 118/68 | HR 62 | Temp 98.0°F | Ht 67.0 in | Wt 246.0 lb

## 2019-12-01 DIAGNOSIS — F17200 Nicotine dependence, unspecified, uncomplicated: Secondary | ICD-10-CM | POA: Diagnosis not present

## 2019-12-01 DIAGNOSIS — Z800 Family history of malignant neoplasm of digestive organs: Secondary | ICD-10-CM

## 2019-12-01 DIAGNOSIS — N62 Hypertrophy of breast: Secondary | ICD-10-CM

## 2019-12-01 DIAGNOSIS — E669 Obesity, unspecified: Secondary | ICD-10-CM | POA: Diagnosis not present

## 2019-12-01 DIAGNOSIS — Z9989 Dependence on other enabling machines and devices: Secondary | ICD-10-CM

## 2019-12-01 DIAGNOSIS — Z125 Encounter for screening for malignant neoplasm of prostate: Secondary | ICD-10-CM

## 2019-12-01 DIAGNOSIS — G4733 Obstructive sleep apnea (adult) (pediatric): Secondary | ICD-10-CM

## 2019-12-01 DIAGNOSIS — Z0000 Encounter for general adult medical examination without abnormal findings: Secondary | ICD-10-CM | POA: Diagnosis not present

## 2019-12-01 DIAGNOSIS — Z1159 Encounter for screening for other viral diseases: Secondary | ICD-10-CM

## 2019-12-02 LAB — CBC
Hematocrit: 40.8 % (ref 37.5–51.0)
Hemoglobin: 14 g/dL (ref 13.0–17.7)
MCH: 28.9 pg (ref 26.6–33.0)
MCHC: 34.3 g/dL (ref 31.5–35.7)
MCV: 84 fL (ref 79–97)
Platelets: 231 10*3/uL (ref 150–450)
RBC: 4.84 x10E6/uL (ref 4.14–5.80)
RDW: 13.3 % (ref 11.6–15.4)
WBC: 6.2 10*3/uL (ref 3.4–10.8)

## 2019-12-02 LAB — CMP14+EGFR
ALT: 26 IU/L (ref 0–44)
AST: 16 IU/L (ref 0–40)
Albumin/Globulin Ratio: 1.7 (ref 1.2–2.2)
Albumin: 4.5 g/dL (ref 4.0–5.0)
Alkaline Phosphatase: 66 IU/L (ref 44–121)
BUN/Creatinine Ratio: 10 (ref 9–20)
BUN: 11 mg/dL (ref 6–24)
Bilirubin Total: 0.3 mg/dL (ref 0.0–1.2)
CO2: 21 mmol/L (ref 20–29)
Calcium: 9.5 mg/dL (ref 8.7–10.2)
Chloride: 104 mmol/L (ref 96–106)
Creatinine, Ser: 1.15 mg/dL (ref 0.76–1.27)
GFR calc Af Amer: 89 mL/min/{1.73_m2} (ref >59)
GFR calc non Af Amer: 77 mL/min/{1.73_m2} (ref >59)
Globulin, Total: 2.7 g/dL (ref 1.5–4.5)
Glucose: 79 mg/dL (ref 65–99)
Potassium: 4.4 mmol/L (ref 3.5–5.2)
Sodium: 139 mmol/L (ref 134–144)
Total Protein: 7.2 g/dL (ref 6.0–8.5)

## 2019-12-02 LAB — LIPID PANEL
Chol/HDL Ratio: 4.9 ratio (ref 0.0–5.0)
Cholesterol, Total: 210 mg/dL — ABNORMAL HIGH (ref 100–199)
HDL: 43 mg/dL (ref >39)
LDL Chol Calc (NIH): 147 mg/dL — ABNORMAL HIGH (ref 0–99)
Triglycerides: 113 mg/dL (ref 0–149)
VLDL Cholesterol Cal: 20 mg/dL (ref 5–40)

## 2019-12-02 LAB — HEMOGLOBIN A1C
Est. average glucose Bld gHb Est-mCnc: 117 mg/dL
Hgb A1c MFr Bld: 5.7 % — ABNORMAL HIGH (ref 4.8–5.6)

## 2019-12-02 LAB — ESTRADIOL: Estradiol: 28 pg/mL (ref 7.6–42.6)

## 2019-12-02 LAB — HEPATITIS C ANTIBODY: Hep C Virus Ab: <0.1 s/co ratio (ref 0.0–0.9)

## 2019-12-02 LAB — HUMAN CHORIONIC GONADOTROPIN(HCG),B-SUBUNIT,QUANTITATIVE): HCG, Beta Chain, Quant, S: <1 m[IU]/mL (ref 0–3)

## 2019-12-22 ENCOUNTER — Telehealth: Payer: Self-pay | Admitting: Gastroenterology

## 2019-12-22 NOTE — Telephone Encounter (Signed)
Hey Dr Ardis Hughs, this pt is being referred by [facility] to [place] for a colonoscopy for (family history of colon cancer). Pt had a colonoscopy done in 2013 but with no recall or recommendations to call back, please advise if another colonoscopy is due since pt is 40

## 2019-12-23 NOTE — Telephone Encounter (Signed)
We need more information about his FH.  During 2013 visit he said no FH of colon cancer.   plaese offer my first availlable OV to discuss

## 2019-12-29 ENCOUNTER — Encounter: Payer: Self-pay | Admitting: Gastroenterology

## 2020-02-22 ENCOUNTER — Ambulatory Visit (INDEPENDENT_AMBULATORY_CARE_PROVIDER_SITE_OTHER): Payer: BC Managed Care – PPO | Admitting: Gastroenterology

## 2020-02-22 ENCOUNTER — Encounter: Payer: Self-pay | Admitting: Gastroenterology

## 2020-02-22 VITALS — BP 120/60 | HR 75 | Ht 67.0 in | Wt 253.0 lb

## 2020-02-22 DIAGNOSIS — Z800 Family history of malignant neoplasm of digestive organs: Secondary | ICD-10-CM | POA: Diagnosis not present

## 2020-02-22 NOTE — Patient Instructions (Signed)
If you are age 44 or older, your body mass index should be between 23-30. Your Body mass index is 39.63 kg/m. If this is out of the aforementioned range listed, please consider follow up with your Primary Care Provider.  If you are age 63 or younger, your body mass index should be between 19-25. Your Body mass index is 39.63 kg/m. If this is out of the aformentioned range listed, please consider follow up with your Primary Care Provider.    You will be due for a recall colonoscopy in May 2023. We will send you a reminder in the mail when it gets closer to that time.  It was great seeing you today!  Thank you for entrusting me with your care and choosing [facility].  Dr. Edison Nasuti

## 2020-02-22 NOTE — Progress Notes (Signed)
Review of pertinent gastrointestinal problems: 1.  Routine risk for colon cancer.  Colonoscopy May 2013 Dr. Ardis Hughs for change in bowel habits found a completely normal colon.  He was recommended to have repeat colon cancer screening at 10-year interval. 2.  GERD related dyspepsia.  EGD May 2013 Dr. Ardis Hughs found mild nonspecific distal gastritis.  Biopsies were negative for H. Pylori.   Blood work September 2021 CBC was normal, complete metabolic profile was normal   HPI: [name]  I last saw him about 7 and half years ago the time of a colonoscopy and upper endoscopy.  See those results summarized above.  Today he tells me that a uncle of his had colon cancer, diagnosed in his 70s.  No other family members have had colon cancer.  He is weight is stable, he is having no constipation, no diarrhea, no serious abdominal pains.  He has had no overt GI bleeding.   Review of systems: Pertinent positive and negative review of systems were noted in the above HPI section. All other review negative.   Past Medical History:  Diagnosis Date  . Allergy    seasonal  . GERD (gastroesophageal reflux disease)   . Hyperlipidemia   . OSA (obstructive sleep apnea)   . Tobacco dependence   . Vitamin D deficiency     History reviewed. No pertinent surgical history.  Current Outpatient Medications  Medication Sig Dispense Refill  . HYDROcodone-acetaminophen (NORCO/VICODIN) 5-325 MG tablet Take 1 tablet by mouth every 6 (six) hours as needed for moderate pain.    . tadalafil (CIALIS) 10 MG tablet Take 1 tablet (10 mg total) by mouth as needed for erectile dysfunction. 30 tablet 0   No current facility-administered medications for this visit.    Allergies as of 02/22/2020  . (No Known Allergies)    Family History  Problem Relation Age of Onset  . Colon polyps Father   . Breast cancer Mother 10  . Colon cancer Maternal Uncle 81    Social History    Socioeconomic History  . Marital status: Single    Spouse name: Not on file  . Number of children: Not on file  . Years of education: Not on file  . Highest education level: Not on file  Occupational History  . Not on file  Tobacco Use  . Smoking status: Current Every Day Smoker    Packs/day: 0.50    Years: 22.00    Pack years: 11.00  . Smokeless tobacco: Never Used  . Tobacco comment: smokes 7-8 cigarettes - just started chantix - he had a smoking cessation course  Substance and Sexual Activity  . Alcohol use: Yes    Comment: occasional  . Drug use: No  . Sexual activity: Not on file  Other Topics Concern  . Not on file  Social History Narrative  . Not on file   Social Determinants of Health   Financial Resource Strain:   . Difficulty of Paying Living Expenses: Not on file  Food Insecurity:   . Worried About Charity fundraiser in the Last Year: Not on file  . Ran Out of Food in the Last Year: Not on file  Transportation Needs:   . Lack of Transportation (Medical): Not on file  . Lack of Transportation (Non-Medical): Not on file  Physical Activity:   . Days of Exercise per Week: Not on file  . Minutes of Exercise per Session: Not on file  Stress:   .  Feeling of Stress : Not on file  Social Connections:   . Frequency of Communication with Friends and Family: Not on file  . Frequency of Social Gatherings with Friends and Family: Not on file  . Attends Religious Services: Not on file  . Active Member of Clubs or Organizations: Not on file  . Attends Archivist Meetings: Not on file  . Marital Status: Not on file  Intimate Partner Violence:   . Fear of Current or Ex-Partner: Not on file  . Emotionally Abused: Not on file  . Physically Abused: Not on file  . Sexually Abused: Not on file     Physical Exam: BP 120/60   Pulse 75   Ht 5\' 7"  (1.702 m)   Wt 253 lb (114.8 kg)   BMI 39.63 kg/m  Constitutional: generally well-appearing Psychiatric:  alert and oriented x3 Eyes: extraocular movements intact Mouth: oral pharynx moist, no lesions Neck: supple no lymphadenopathy Cardiovascular: heart regular rate and rhythm Lungs: clear to auscultation bilaterally Abdomen: soft, nontender, nondistended, no obvious ascites, no peritoneal signs, normal bowel sounds Extremities: no lower extremity edema bilaterally Skin: no lesions on visible extremities   Assessment and plan: 44 y.o. male with family history colon cancer  He does have a family history of colon cancer but it is not significant enough to warrant increased colon cancer screening for him.  He has 1 second-degree relative with colon cancer.  I recommended colonoscopy for colon cancer screening 10 years from his last 1 which would make it May 2023 and we will put him in our recall system for that.  He understands that if he has any issues with his bowels prior to then that he is concerned about he should call.  I specifically mentioned bleeding, serious constipation or diarrhea, terrible abdominal pains.  Please see the "Patient Instructions" section for addition details about the plan.   Owens Loffler, MD [facility] 02/22/2020, 3:08 PM  Cc: [name], FNP  Total time on date of encounter was 30  minutes (this included time spent preparing to see the patient reviewing records; obtaining and/or reviewing separately obtained history; performing a medically appropriate exam and/or evaluation; counseling and educating the patient and family if present; ordering medications, tests or procedures if applicable; and documenting clinical information in the health record).

## 2020-04-25 ENCOUNTER — Ambulatory Visit: Payer: BC Managed Care – PPO | Admitting: Neurology

## 2020-04-25 ENCOUNTER — Other Ambulatory Visit: Payer: Self-pay

## 2020-04-25 ENCOUNTER — Encounter: Payer: Self-pay | Admitting: Neurology

## 2020-04-25 VITALS — BP 126/72 | HR 73 | Ht 67.0 in | Wt 259.0 lb

## 2020-04-25 DIAGNOSIS — G4733 Obstructive sleep apnea (adult) (pediatric): Secondary | ICD-10-CM | POA: Diagnosis not present

## 2020-04-25 DIAGNOSIS — Z9989 Dependence on other enabling machines and devices: Secondary | ICD-10-CM

## 2020-04-25 NOTE — Patient Instructions (Signed)
Continue CPAP use every night > 4 hours See you back in 1 year
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
## 2020-04-26 NOTE — Progress Notes (Signed)
CM sent to [facility]  

## 2020-05-31 ENCOUNTER — Encounter: Payer: Self-pay | Admitting: Nurse Practitioner

## 2020-05-31 ENCOUNTER — Ambulatory Visit (INDEPENDENT_AMBULATORY_CARE_PROVIDER_SITE_OTHER): Payer: BC Managed Care – PPO | Admitting: Nurse Practitioner

## 2020-05-31 ENCOUNTER — Other Ambulatory Visit: Payer: Self-pay

## 2020-05-31 VITALS — BP 118/78 | HR 72 | Temp 98.2°F | Ht 67.2 in | Wt 260.2 lb

## 2020-05-31 DIAGNOSIS — R591 Generalized enlarged lymph nodes: Secondary | ICD-10-CM

## 2020-05-31 DIAGNOSIS — N62 Hypertrophy of breast: Secondary | ICD-10-CM

## 2020-05-31 DIAGNOSIS — E7800 Pure hypercholesterolemia, unspecified: Secondary | ICD-10-CM | POA: Diagnosis not present

## 2020-05-31 DIAGNOSIS — R7303 Prediabetes: Secondary | ICD-10-CM

## 2020-05-31 DIAGNOSIS — E7849 Other hyperlipidemia: Secondary | ICD-10-CM | POA: Diagnosis not present

## 2020-05-31 DIAGNOSIS — Z6841 Body Mass Index (BMI) 40.0 and over, adult: Secondary | ICD-10-CM

## 2020-05-31 DIAGNOSIS — E66813 Obesity, class 3: Secondary | ICD-10-CM

## 2020-05-31 NOTE — Progress Notes (Signed)
I,Yamilka Roman [employer] as a Education administrator for [employer], FNP.,have documented all relevant documentation on the behalf of [name], FNP,as directed by  [name], FNP while in the presence of [name], [place]. This visit occurred during the SARS-CoV-2 public health emergency.  Safety protocols were in place, including screening questions prior to the visit, additional usage of staff PPE, and extensive cleaning of exam room while observing appropriate contact time as indicated for disinfecting solutions.  Subjective:     Patient ID: Bruce Brooks. , male    DOB: October 11, 1975 , 45 y.o.   MRN: [id number]   Chief Complaint  Patient presents with  . abnormal glucose  . Weight Check    HPI  Patient presents today for a f/u on his weight and abnormal glucose. Continues to eat out a lot. Also is not exercising regularly. He is not on any medication for his prediabetes.   Wt Readings from Last 3 Encounters: 05/31/20 : 260 lb 3.2 oz (118 kg) 04/25/20 : 259 lb (117.5 kg) 02/22/20 : 253 lb (114.8 kg)       Past Medical History:  Diagnosis Date  . Allergy    seasonal  . GERD (gastroesophageal reflux disease)   . Hyperlipidemia   . OSA (obstructive sleep apnea)   . Tobacco dependence   . Vitamin D deficiency      Family History  Problem Relation Age of Onset  . Colon polyps Father   . Breast cancer Mother 15  . Colon cancer Maternal Uncle 57    No current outpatient medications on file.   No Known Allergies   Review of Systems  Constitutional: Negative.   HENT: Negative.   Eyes: Negative.   Respiratory: Negative.   Cardiovascular: Negative.   Gastrointestinal: Negative.   Endocrine: Negative.   Genitourinary: Negative.   Musculoskeletal: Negative.   Skin: Negative.   Neurological: Negative.   Hematological: Negative.   Psychiatric/Behavioral: Negative.      Today's Vitals   05/31/20 0924  BP: 118/78  Pulse: 72  Temp: 98.2 F (36.8 C)   TempSrc: Oral  Weight: 260 lb 3.2 oz (118 kg)  Height: 5' 7.2" (1.707 m)  PainSc: 0-No pain   Body mass index is 40.51 kg/m.   Objective:  Physical Exam Constitutional:      General: He is not in acute distress.    Appearance: Normal appearance. He is obese.  Cardiovascular:     Rate and Rhythm: Normal rate and regular rhythm.     Pulses: Normal pulses.     Heart sounds: Normal heart sounds. No murmur heard.   Pulmonary:     Effort: Pulmonary effort is normal. No respiratory distress.     Breath sounds: Normal breath sounds. No wheezing.  Musculoskeletal:        General: Normal range of motion.     Cervical back: Normal range of motion and neck supple.  Skin:    General: Skin is warm and dry.     Capillary Refill: Capillary refill takes less than 2 seconds.  Neurological:     General: No focal deficit present.     Mental Status: He is alert and oriented to person, place, and time.     Cranial Nerves: No cranial nerve deficit.     Motor: No weakness.  Psychiatric:        Mood and Affect: Mood normal.        Behavior: Behavior normal.  Thought Content: Thought content normal.        Judgment: Judgment normal.         Assessment And Plan:     1. Prediabetes  Chronic, elevated   No current medications  Encouraged to limit intake of sugary foods and drinks  Encouraged to increase physical activity to 150 minutes per week - Hemoglobin A1c - VITAMIN D 25 Hydroxy (Vit-D Deficiency, Fractures)  2. Other hyperlipidemia  Chronic, slightly elevated as last visit  Encouraged to avoid fried and fatty - Lipid panel  3. Class 3 severe obesity with serious comorbidity and body mass index (BMI) of 40.0 to 44.9 in adult, unspecified obesity type (HCC)  Chronic  Discussed healthy diet and regular exercise options   Encouraged to exercise at least 150 minutes per week with 2 days of strength training.  4. Lymphadenopathy of head and neck   right side of  neck at submandibular area firm  Will check ultrasound of neck    Patient was given opportunity to ask questions. Patient verbalized understanding of the plan and was able to repeat key elements of the plan. All questions were answered to their satisfaction.  [name], FNP   I, [name], FNP, have reviewed all documentation for this visit. The documentation on 05/31/20 for the exam, diagnosis, procedures, and orders are all accurate and complete.   IF YOU HAVE BEEN REFERRED TO A SPECIALIST, IT MAY TAKE 1-2 WEEKS TO SCHEDULE/PROCESS THE REFERRAL. IF YOU HAVE NOT HEARD FROM US/SPECIALIST IN TWO WEEKS, PLEASE GIVE [place] A CALL AT [phone].   THE PATIENT IS ENCOURAGED TO PRACTICE SOCIAL DISTANCING DUE TO THE COVID-19 PANDEMIC.

## 2020-05-31 NOTE — Patient Instructions (Signed)

## 2020-06-01 LAB — HEMOGLOBIN A1C
Est. average glucose Bld gHb Est-mCnc: 117 mg/dL
Hgb A1c MFr Bld: 5.7 % — ABNORMAL HIGH (ref 4.8–5.6)

## 2020-06-01 LAB — LIPID PANEL
Chol/HDL Ratio: 4.4 ratio (ref 0.0–5.0)
Cholesterol, Total: 184 mg/dL (ref 100–199)
HDL: 42 mg/dL (ref >39)
LDL Chol Calc (NIH): 125 mg/dL — ABNORMAL HIGH (ref 0–99)
Triglycerides: 95 mg/dL (ref 0–149)
VLDL Cholesterol Cal: 17 mg/dL (ref 5–40)

## 2020-06-01 LAB — VITAMIN D 25 HYDROXY (VIT D DEFICIENCY, FRACTURES): Vit D, 25-Hydroxy: 12.5 ng/mL — ABNORMAL LOW (ref 30.0–100.0)

## 2020-06-04 ENCOUNTER — Encounter: Payer: Self-pay | Admitting: Nurse Practitioner

## 2020-06-04 MED ORDER — VITAMIN D (ERGOCALCIFEROL) 1.25 MG (50000 UNIT) PO CAPS: 50000.0000 [IU] | ORAL_CAPSULE | ORAL | 1 refills | Status: DC

## 2020-06-14 ENCOUNTER — Ambulatory Visit
Admission: RE | Admit: 2020-06-14 | Discharge: 2020-06-14 | Disposition: A | Discharge status: Home / Self Care | Payer: BC Managed Care – PPO | Source: Ambulatory Visit | Attending: Nurse Practitioner | Admitting: Nurse Practitioner

## 2020-06-14 DIAGNOSIS — N62 Hypertrophy of breast: Secondary | ICD-10-CM

## 2020-06-14 DIAGNOSIS — R591 Generalized enlarged lymph nodes: Secondary | ICD-10-CM

## 2020-09-04 ENCOUNTER — Encounter: Payer: Self-pay | Admitting: Nurse Practitioner

## 2020-09-04 ENCOUNTER — Ambulatory Visit (INDEPENDENT_AMBULATORY_CARE_PROVIDER_SITE_OTHER): Payer: BC Managed Care – PPO | Admitting: Nurse Practitioner

## 2020-09-04 ENCOUNTER — Other Ambulatory Visit: Payer: Self-pay

## 2020-09-04 VITALS — BP 124/82 | HR 63 | Temp 98.7°F | Ht 68.2 in | Wt 255.8 lb

## 2020-09-04 DIAGNOSIS — R7303 Prediabetes: Secondary | ICD-10-CM

## 2020-09-04 DIAGNOSIS — E559 Vitamin D deficiency, unspecified: Secondary | ICD-10-CM | POA: Diagnosis not present

## 2020-09-04 DIAGNOSIS — G4733 Obstructive sleep apnea (adult) (pediatric): Secondary | ICD-10-CM

## 2020-09-04 DIAGNOSIS — E7800 Pure hypercholesterolemia, unspecified: Secondary | ICD-10-CM

## 2020-09-04 DIAGNOSIS — Z9989 Dependence on other enabling machines and devices: Secondary | ICD-10-CM

## 2020-09-04 DIAGNOSIS — E669 Obesity, unspecified: Secondary | ICD-10-CM | POA: Diagnosis not present

## 2020-09-04 NOTE — Patient Instructions (Addendum)
Exercising to Lose Weight Exercise is structured, repetitive physical activity to improve fitness and health. Getting regular exercise is important for everyone. It is especially important if you are overweight. Being overweight increases your risk of heart disease, stroke, diabetes, high blood pressure, and several types of cancer.Reducing your calorie intake and exercising can help you lose weight. Exercise is usually categorized as moderate or vigorous intensity. To lose weight, most people need to do a certain amount of moderate-intensity orvigorous-intensity exercise each week. Moderate-intensity exercise  Moderate-intensity exercise is any activity that gets you moving enough to burn at least three times more energy (calories) than if you were sitting. Examples of moderate exercise include: Walking a mile in 15 minutes. Doing light yard work. Biking at an easy pace. Most people should get at least 150 minutes (2 hours and 30 minutes) a week ofmoderate-intensity exercise to maintain their body weight. Vigorous-intensity exercise Vigorous-intensity exercise is any activity that gets you moving enough to burn at least six times more calories than if you were sitting. When you exercise at this intensity, you should be working hard enough that you are not able tocarry on a conversation. Examples of vigorous exercise include: Running. Playing a team sport, such as football, basketball, and soccer. Jumping rope. Most people should get at least 75 minutes (1 hour and 15 minutes) a week ofvigorous-intensity exercise to maintain their body weight. How can exercise affect me? When you exercise enough to burn more calories than you eat, you lose weight. Exercise also reduces body fat and builds muscle. The more muscle you have, the more calories you burn. Exercise also: Improves mood. Reduces stress and tension. Improves your overall fitness, flexibility, and endurance. Increases bone strength. The  amount of exercise you need to lose weight depends on: Your age. The type of exercise. Any health conditions you have. Your overall physical ability. Talk to your health care provider about how much exercise you need and whattypes of activities are safe for you. What actions can I take to lose weight? Nutrition  Make changes to your diet as told by your health care provider or diet and nutrition specialist (dietitian). This may include: Eating fewer calories. Eating more protein. Eating less unhealthy fats. Eating a diet that includes fresh fruits and vegetables, whole grains, low-fat dairy products, and lean protein. Avoiding foods with added fat, salt, and sugar. Drink plenty of water while you exercise to prevent dehydration or heat stroke.  Activity Choose an activity that you enjoy and set realistic goals. Your health care provider can help you make an exercise plan that works for you. Exercise at a moderate or vigorous intensity most days of the week. The intensity of exercise may vary from person to person. You can tell how intense a workout is for you by paying attention to your breathing and heartbeat. Most people will notice their breathing and heartbeat get faster with more intense exercise. Do resistance training twice each week, such as: Push-ups. Sit-ups. Lifting weights. Using resistance bands. Getting short amounts of exercise can be just as helpful as long structured periods of exercise. If you have trouble finding time to exercise, try to include exercise in your daily routine. Get up, stretch, and walk around every 30 minutes throughout the day. Go for a walk during your lunch break. Park your car farther away from your destination. If you take public transportation, get off one stop early and walk the rest of the way. Make phone calls while standing up and   walking around. Take the stairs instead of elevators or escalators. Wear comfortable clothes and shoes with  good support. Do not exercise so much that you hurt yourself, feel dizzy, or get very short of breath. Where to find more information [place]. Department of Health and Human Services: [employer]at Centers for Disease Control and Prevention (CDC): http://www.wolf.info/ Contact a health care provider: Before starting a new exercise program. If you have questions or concerns about your weight. If you have a medical problem that keeps you from exercising. Get help right away if you have any of the following while exercising: Injury. Dizziness. Difficulty breathing or shortness of breath that does not go away when you stop exercising. Chest pain. Rapid heartbeat. Summary Being overweight increases your risk of heart disease, stroke, diabetes, high blood pressure, and several types of cancer. Losing weight happens when you burn more calories than you eat. Reducing the amount of calories you eat in addition to getting regular moderate or vigorous exercise each week helps you lose weight. This information is not intended to replace advice given to you by your health care provider. Make sure you discuss any questions you have with your healthcare provider. Document Revised: 06/13/2019 Document Reviewed: 06/30/2019 Elsevier Patient Education  2022 [employer].   [email or web address] - Beginners guide to intermittent fasting Dr. Sharman Cheek

## 2020-09-04 NOTE — Progress Notes (Signed)
This visit occurred during the SARS-CoV-2 public health emergency.  Safety protocols were in place, including screening questions prior to the visit, additional usage of staff PPE, and extensive cleaning of exam room while observing appropriate contact time as indicated for disinfecting solutions.  Subjective:     Patient ID: Bruce Brooks. , male    DOB: Jul 13, 1975 , 45 y.o.   MRN: [id number]   Chief Complaint  Patient presents with   Weight Check   Prediabetes    HPI  Patient presents today for a prediabetes and weight check.  He is going to the gym 2 days a week but his aim is 3 days a week. Reports he has been doing fairly well with diet 60-70%. He has been using a meal prep company, incorporating more salads into his meals. Drinks at least 100 oz of water a day, will have coffee in the morning with cream and sugar. He will skip breakfast, eats between 12-2p and 7-9p.    Wt Readings from Last 3 Encounters: 09/04/20 : 255 lb 12.8 oz (116 kg) 05/31/20 : 260 lb 3.2 oz (118 kg) 04/25/20 : 259 lb (117.5 kg)      Past Medical History:  Diagnosis Date   Allergy    seasonal   GERD (gastroesophageal reflux disease)    Hyperlipidemia    OSA (obstructive sleep apnea)    Tobacco dependence    Vitamin D deficiency      Family History  Problem Relation Age of Onset   Colon polyps Father    Breast cancer Mother 74   Colon cancer Maternal Uncle 67     Current Outpatient Medications:    Vitamin D, Ergocalciferol, (DRISDOL) 1.25 MG (50000 UNIT) CAPS capsule, Take 1 capsule (50,000 Units total) by mouth 2 (two) times a week., Disp: 24 capsule, Rfl: 1   No Known Allergies   Review of Systems  Constitutional: Negative.   Eyes: Negative.   Respiratory: Negative.    Cardiovascular: Negative.  Negative for chest pain, palpitations and leg swelling.  Musculoskeletal: Negative.   Skin: Negative.   Psychiatric/Behavioral: Negative.      Today's Vitals   09/04/20 0922  BP:  124/82  Pulse: 63  Temp: 98.7 F (37.1 C)  Weight: 255 lb 12.8 oz (116 kg)  Height: 5' 8.2" (1.732 m)  PainSc: 0-No pain   Body mass index is 38.67 kg/m.   Objective:  Physical Exam Vitals reviewed.  Constitutional:      General: He is not in acute distress.    Appearance: Normal appearance. He is obese.  Cardiovascular:     Rate and Rhythm: Normal rate and regular rhythm.     Pulses: Normal pulses.     Heart sounds: Normal heart sounds. No murmur heard. Pulmonary:     Effort: Pulmonary effort is normal. No respiratory distress.     Breath sounds: Normal breath sounds. No wheezing.  Musculoskeletal:        General: Normal range of motion.     Cervical back: Normal range of motion and neck supple.  Skin:    General: Skin is warm and dry.     Capillary Refill: Capillary refill takes less than 2 seconds.  Neurological:     General: No focal deficit present.     Mental Status: He is alert and oriented to person, place, and time.     Cranial Nerves: No cranial nerve deficit.     Motor: No weakness.  Psychiatric:  Mood and Affect: Mood normal.        Behavior: Behavior normal.        Thought Content: Thought content normal.        Judgment: Judgment normal.        Assessment And Plan:     1. Prediabetes Chronic, stable No current medications Encouraged to limit intake of sugary foods and drinks Encouraged to increase physical activity to 150 minutes per week - BMP8+eGFR  2. Obesity (BMI 35.0-39.9 without comorbidity) Chronic Discussed healthy diet and regular exercise options  Encouraged to exercise at least 150 minutes per week with 2 days of strength training We had a long discussion about eating a healthy diet and to consider intermittent fasting based on how he is currently eating Given information on intermittent fasting by Dr. Sharman Cheek [email or web address]  3. Elevated cholesterol Chronic, controlled Continue with current  medications - Lipid panel  4. Vitamin D deficiency Will check vitamin D level and supplement as needed.    Also encouraged to spend 15 minutes in the sun daily.  - VITAMIN D 25 Hydroxy (Vit-D Deficiency, Fractures)  5. Obstructive sleep apnea treated with continuous positive airway pressure (CPAP) He is using CPAP nightly, he will be contacting neurology/DME for new head gear  Patient was given opportunity to ask questions. Patient verbalized understanding of the plan and was able to repeat key elements of the plan. All questions were answered to their satisfaction.  [name], FNP   I, [name], FNP, have reviewed all documentation for this visit. The documentation on 09/04/20 for the exam, diagnosis, procedures, and orders are all accurate and complete.   IF YOU HAVE BEEN REFERRED TO A SPECIALIST, IT MAY TAKE 1-2 WEEKS TO SCHEDULE/PROCESS THE REFERRAL. IF YOU HAVE NOT HEARD FROM US/SPECIALIST IN TWO WEEKS, PLEASE GIVE [place] A CALL AT [phone].   THE PATIENT IS ENCOURAGED TO PRACTICE SOCIAL DISTANCING DUE TO THE COVID-19 PANDEMIC.

## 2020-09-05 LAB — LIPID PANEL
Chol/HDL Ratio: 4.5 ratio (ref 0.0–5.0)
Cholesterol, Total: 200 mg/dL — ABNORMAL HIGH (ref 100–199)
HDL: 44 mg/dL (ref >39)
LDL Chol Calc (NIH): 137 mg/dL — ABNORMAL HIGH (ref 0–99)
Triglycerides: 103 mg/dL (ref 0–149)
VLDL Cholesterol Cal: 19 mg/dL (ref 5–40)

## 2020-09-05 LAB — BMP8+EGFR
BUN/Creatinine Ratio: 8 — ABNORMAL LOW (ref 9–20)
BUN: 9 mg/dL (ref 6–24)
CO2: 22 mmol/L (ref 20–29)
Calcium: 9.7 mg/dL (ref 8.7–10.2)
Chloride: 103 mmol/L (ref 96–106)
Creatinine, Ser: 1.1 mg/dL (ref 0.76–1.27)
Glucose: 79 mg/dL (ref 65–99)
Potassium: 4.3 mmol/L (ref 3.5–5.2)
Sodium: 140 mmol/L (ref 134–144)
eGFR: 84 mL/min/{1.73_m2} (ref >59)

## 2020-09-05 LAB — VITAMIN D 25 HYDROXY (VIT D DEFICIENCY, FRACTURES): Vit D, 25-Hydroxy: 15 ng/mL — ABNORMAL LOW (ref 30.0–100.0)

## 2020-12-06 ENCOUNTER — Ambulatory Visit (INDEPENDENT_AMBULATORY_CARE_PROVIDER_SITE_OTHER): Payer: BC Managed Care – PPO | Admitting: Nurse Practitioner

## 2020-12-06 ENCOUNTER — Encounter: Payer: Self-pay | Admitting: Nurse Practitioner

## 2020-12-06 ENCOUNTER — Other Ambulatory Visit: Payer: Self-pay

## 2020-12-06 VITALS — BP 120/68 | HR 60 | Temp 98.6°F | Ht 68.0 in | Wt 249.8 lb

## 2020-12-06 DIAGNOSIS — Z125 Encounter for screening for malignant neoplasm of prostate: Secondary | ICD-10-CM

## 2020-12-06 DIAGNOSIS — R7303 Prediabetes: Secondary | ICD-10-CM | POA: Diagnosis not present

## 2020-12-06 DIAGNOSIS — Z2821 Immunization not carried out because of patient refusal: Secondary | ICD-10-CM

## 2020-12-06 DIAGNOSIS — Z0000 Encounter for general adult medical examination without abnormal findings: Secondary | ICD-10-CM

## 2020-12-06 DIAGNOSIS — E7849 Other hyperlipidemia: Secondary | ICD-10-CM | POA: Diagnosis not present

## 2020-12-06 DIAGNOSIS — E559 Vitamin D deficiency, unspecified: Secondary | ICD-10-CM | POA: Diagnosis not present

## 2020-12-06 DIAGNOSIS — R5383 Other fatigue: Secondary | ICD-10-CM

## 2020-12-06 DIAGNOSIS — E66812 Obesity, class 2: Secondary | ICD-10-CM

## 2020-12-06 DIAGNOSIS — Z6837 Body mass index (BMI) 37.0-37.9, adult: Secondary | ICD-10-CM

## 2020-12-06 DIAGNOSIS — Z79899 Other long term (current) drug therapy: Secondary | ICD-10-CM

## 2020-12-06 DIAGNOSIS — E6609 Other obesity due to excess calories: Secondary | ICD-10-CM

## 2020-12-06 NOTE — Patient Instructions (Signed)
Health Maintenance, Male Adopting a healthy lifestyle and getting preventive care are important in promoting health and wellness. Ask your health care provider about: The right schedule for you to have regular tests and exams. Things you can do on your own to prevent diseases and keep yourself healthy. What should I know about diet, weight, and exercise? Eat a healthy diet  Eat a diet that includes plenty of vegetables, fruits, low-fat dairy products, and lean protein. Do not eat a lot of foods that are high in solid fats, added sugars, or sodium. Maintain a healthy weight Body mass index (BMI) is a measurement that can be used to identify possible weight problems. It estimates body fat based on height and weight. Your health care provider can help determine your BMI and help you achieve or maintain a healthy weight. Get regular exercise Get regular exercise. This is one of the most important things you can do for your health. Most adults should: Exercise for at least 150 minutes each week. The exercise should increase your heart rate and make you sweat (moderate-intensity exercise). Do strengthening exercises at least twice a week. This is in addition to the moderate-intensity exercise. Spend less time sitting. Even light physical activity can be beneficial. Watch cholesterol and blood lipids Have your blood tested for lipids and cholesterol at 45 years of age, then have this test every 5 years. You may need to have your cholesterol levels checked more often if: Your lipid or cholesterol levels are high. You are older than 45 years of age. You are at high risk for heart disease. What should I know about cancer screening? Many types of cancers can be detected early and may often be prevented. Depending on your health history and family history, you may need to have cancer screening at various ages. This may include screening for: Colorectal cancer. Prostate cancer. Skin cancer. Lung  cancer. What should I know about heart disease, diabetes, and high blood pressure? Blood pressure and heart disease High blood pressure causes heart disease and increases the risk of stroke. This is more likely to develop in people who have high blood pressure readings, are of African descent, or are overweight. Talk with your health care provider about your target blood pressure readings. Have your blood pressure checked: Every 3-5 years if you are 18-39 years of age. Every year if you are 40 years old or older. If you are between the ages of 65 and 75 and are a current or former smoker, ask your health care provider if you should have a one-time screening for abdominal aortic aneurysm (AAA). Diabetes Have regular diabetes screenings. This checks your fasting blood sugar level. Have the screening done: Once every three years after age 45 if you are at a normal weight and have a low risk for diabetes. More often and at a younger age if you are overweight or have a high risk for diabetes. What should I know about preventing infection? Hepatitis B If you have a higher risk for hepatitis B, you should be screened for this virus. Talk with your health care provider to find out if you are at risk for hepatitis B infection. Hepatitis C Blood testing is recommended for: Everyone born from 1945 through 1965. Anyone with known risk factors for hepatitis C. Sexually transmitted infections (STIs) You should be screened each year for STIs, including gonorrhea and chlamydia, if: You are sexually active and are younger than 45 years of age. You are older than 45 years   of age and your health care provider tells you that you are at risk for this type of infection. Your sexual activity has changed since you were last screened, and you are at increased risk for chlamydia or gonorrhea. Ask your health care provider if you are at risk. Ask your health care provider about whether you are at high risk for HIV.  Your health care provider may recommend a prescription medicine to help prevent HIV infection. If you choose to take medicine to prevent HIV, you should first get tested for HIV. You should then be tested every 3 months for as long as you are taking the medicine. Follow these instructions at home: Lifestyle Do not use any products that contain nicotine or tobacco, such as cigarettes, e-cigarettes, and chewing tobacco. If you need help quitting, ask your health care provider. Do not use street drugs. Do not share needles. Ask your health care provider for help if you need support or information about quitting drugs. Alcohol use Do not drink alcohol if your health care provider tells you not to drink. If you drink alcohol: Limit how much you have to 0-2 drinks a day. Be aware of how much alcohol is in your drink. In the [place] one drink equals one 12 oz bottle of beer (355 mL), one 5 oz glass of wine (148 mL), or one 1 oz glass of hard liquor (44 mL). General instructions Schedule regular health, dental, and eye exams. Stay current with your vaccines. Tell your health care provider if: You often feel depressed. You have ever been abused or do not feel safe at home. Summary Adopting a healthy lifestyle and getting preventive care are important in promoting health and wellness. Follow your health care provider's instructions about healthy diet, exercising, and getting tested or screened for diseases. Follow your health care provider's instructions on monitoring your cholesterol and blood pressure. This information is not intended to replace advice given to you by your health care provider. Make sure you discuss any questions you have with your health care provider. Document Revised: 05/11/2020 Document Reviewed: 02/24/2018 Elsevier Patient Education  2022 Elsevier Inc.  

## 2020-12-06 NOTE — Progress Notes (Signed)
I,Bruce Brooks,acting as a Education administrator for [employer], FNP.,have documented all relevant documentation on the behalf of Bruce Brine, FNP,as directed by  Bruce Brine, FNP while in the presence of Bruce Brooks, [place].  This visit occurred during the SARS-CoV-2 public health emergency.  Safety protocols were in place, including screening questions prior to the visit, additional usage of staff PPE, and extensive cleaning of exam room while observing appropriate contact time as indicated for disinfecting solutions.  Subjective:     Patient ID: Bruce Brooks. , male    DOB: 1975/10/21 , 45 y.o.   MRN: [id number]   Chief Complaint  Patient presents with   Annual Exam    HPI  Here for HM.  Continues to use the CPAP machine - feels like he is sleeping better with the CPAP  Wt Readings from Last 3 Encounters: 12/06/20 : 249 lb 12.8 oz (113.3 kg) 09/04/20 : 255 lb 12.8 oz (116 kg) 05/31/20 : 260 lb 3.2 oz (118 kg)       Past Medical History:  Diagnosis Date   Allergy    seasonal   GERD (gastroesophageal reflux disease)    Hyperlipidemia    OSA (obstructive sleep apnea)    Tobacco dependence    Vitamin D deficiency      Family History  Problem Relation Age of Onset   Colon polyps Father    Breast cancer Mother 52   Colon cancer Maternal Uncle 19     Current Outpatient Medications:    Vitamin D, Ergocalciferol, (DRISDOL) 1.25 MG (50000 UNIT) CAPS capsule, Take 1 capsule (50,000 Units total) by mouth 2 (two) times a week., Disp: 24 capsule, Rfl: 1   No Known Allergies   Men's preventive visit. Patient Health Questionnaire (PHQ-2) is  [place] Office Visit from 05/31/2020 in [facility]  PHQ-2 Total Score 0      Patient is on a Regular diet; admits to it being much better.  Exercising 2-3 times a week. Marital status: Single. Relevant history for alcohol use is:  Social History   Substance and Sexual Activity  Alcohol Use Yes    Comment: occasional  . Relevant history for tobacco use is:  Social History   Tobacco Use  Smoking Status Every Day   Packs/day: 0.50   Years: 22.00   Pack years: 11.00   Types: Cigarettes  Smokeless Tobacco Never  Tobacco Comments   smokes 7-8 cigarettes - just started chantix - he had a smoking cessation course. Now smoking 1/2 PPD. No longer taking chantix. Smoking approx 8 cig - 9/22  .   Review of Systems  Constitutional: Negative.   HENT: Negative.    Eyes: Negative.   Respiratory: Negative.    Cardiovascular: Negative.  Negative for chest pain, palpitations and leg swelling.  Gastrointestinal: Negative.   Endocrine: Negative.   Genitourinary: Negative.   Musculoskeletal: Negative.   Skin: Negative.   Allergic/Immunologic: Negative.   Neurological: Negative.   Hematological: Negative.   Psychiatric/Behavioral: Negative.      Today's Vitals   12/06/20 1015  BP: 120/68  Pulse: 60  Temp: 98.6 F (37 C)  TempSrc: Oral  Weight: 249 lb 12.8 oz (113.3 kg)  Height: 5' 8"  (1.727 m)   Body mass index is 37.98 kg/m.  Wt Readings from Last 3 Encounters:  12/06/20 249 lb 12.8 oz (113.3 kg)  09/04/20 255 lb 12.8 oz (116 kg)  05/31/20 260 lb 3.2 oz (118 kg)    Objective:  Physical Exam Vitals reviewed.  Constitutional:      General: He is not in acute distress.    Appearance: Normal appearance. He is obese.  HENT:     Head: Normocephalic and atraumatic.     Right Ear: Tympanic membrane, ear canal and external ear normal. There is no impacted cerumen.     Left Ear: Tympanic membrane, ear canal and external ear normal. There is no impacted cerumen.     Nose:     Comments: Deferred - masked    Mouth/Throat:     Comments: Deferred - masked Eyes:     Extraocular Movements: Extraocular movements intact.     Conjunctiva/sclera: Conjunctivae normal.     Pupils: Pupils are equal, round, and reactive to light.  Cardiovascular:     Rate and Rhythm: Normal rate and  regular rhythm.     Pulses: Normal pulses.     Heart sounds: Normal heart sounds. No murmur heard. Pulmonary:     Effort: Pulmonary effort is normal. No respiratory distress.     Breath sounds: Normal breath sounds. No wheezing.  Abdominal:     General: Abdomen is flat. Bowel sounds are normal. There is no distension.     Palpations: Abdomen is soft.     Tenderness: There is no abdominal tenderness.  Genitourinary:    Penis: Normal.      Testes: Normal.     Prostate: Normal.     Rectum: Normal.  Musculoskeletal:        General: No swelling or tenderness. Normal range of motion.     Cervical back: Normal range of motion and neck supple.  Skin:    General: Skin is warm.     Capillary Refill: Capillary refill takes less than 2 seconds.  Neurological:     General: No focal deficit present.     Mental Status: He is alert and oriented to person, place, and time.     Cranial Nerves: No cranial nerve deficit.     Motor: No weakness.  Psychiatric:        Mood and Affect: Mood normal.        Behavior: Behavior normal.        Thought Content: Thought content normal.        Judgment: Judgment normal.        Assessment And Plan:    1. Encounter for general adult medical examination w/o abnormal findings Behavior modifications discussed and diet history reviewed.   Pt will continue to exercise regularly and modify diet with low GI, plant based foods and decrease intake of processed foods.  Recommend intake of daily multivitamin, Vitamin D, and calcium.  Recommend for preventive screenings, as well as recommend immunizations that include influenza, TDAP (up to date)  2. Prediabetes Chronic, well controlled - Hemoglobin A1c - CMP14+EGFR  3. Other hyperlipidemia Stable, no current medications - CMP14+EGFR - Lipid panel  4. Vitamin D deficiency Will check vitamin D level and supplement as needed.    Also encouraged to spend 15 minutes in the sun daily.  - VITAMIN D 25 Hydroxy  (Vit-D Deficiency, Fractures)  5. Influenza vaccination declined Patient declined influenza vaccination at this time. Patient is aware that influenza vaccine prevents illness in 70% of healthy people, and reduces hospitalizations to 30-70% in elderly. This vaccine is recommended annually. Pt is willing to accept risk associated with refusing vaccination.  6. Class 2 obesity due to excess calories without serious comorbidity with body mass index (BMI) of 37.0  to 37.9 in adult Encouraged to increase physical activity and eating a healthy diet.  He is encouraged to initially strive for BMI less than 30 to decrease cardiac risk. He is advised to exercise no less than 150 minutes per week.   7. Other fatigue Will check testosterone level again, he had been to urology in the past and at that time did not want to do testoterone - Testosterone, Total  8. Encounter for prostate cancer screening Normal prostate manual exam - PSA  9. Other long term (current) drug therapy - CBC      Patient was given opportunity to ask questions. Patient verbalized understanding of the plan and was able to repeat key elements of the plan. All questions were answered to their satisfaction.   Bruce Brine, FNP   I, Bruce Brine, FNP, have reviewed all documentation for this visit. The documentation on 12/06/20 for the exam, diagnosis, procedures, and orders are all accurate and complete.   THE PATIENT IS ENCOURAGED TO PRACTICE SOCIAL DISTANCING DUE TO THE COVID-19 PANDEMIC.

## 2020-12-07 LAB — CBC
Hematocrit: 41.6 % (ref 37.5–51.0)
Hemoglobin: 14.1 g/dL (ref 13.0–17.7)
MCH: 29.3 pg (ref 26.6–33.0)
MCHC: 33.9 g/dL (ref 31.5–35.7)
MCV: 87 fL (ref 79–97)
Platelets: 241 10*3/uL (ref 150–450)
RBC: 4.81 x10E6/uL (ref 4.14–5.80)
RDW: 13.8 % (ref 11.6–15.4)
WBC: 6.1 10*3/uL (ref 3.4–10.8)

## 2020-12-07 LAB — CMP14+EGFR
ALT: 23 IU/L (ref 0–44)
AST: 15 IU/L (ref 0–40)
Albumin/Globulin Ratio: 1.8 (ref 1.2–2.2)
Albumin: 4.4 g/dL (ref 4.0–5.0)
Alkaline Phosphatase: 55 IU/L (ref 44–121)
BUN/Creatinine Ratio: 13 (ref 9–20)
BUN: 15 mg/dL (ref 6–24)
Bilirubin Total: 0.3 mg/dL (ref 0.0–1.2)
CO2: 19 mmol/L — ABNORMAL LOW (ref 20–29)
Calcium: 9.4 mg/dL (ref 8.7–10.2)
Chloride: 104 mmol/L (ref 96–106)
Creatinine, Ser: 1.18 mg/dL (ref 0.76–1.27)
Globulin, Total: 2.4 g/dL (ref 1.5–4.5)
Glucose: 77 mg/dL (ref 65–99)
Potassium: 4.3 mmol/L (ref 3.5–5.2)
Sodium: 139 mmol/L (ref 134–144)
Total Protein: 6.8 g/dL (ref 6.0–8.5)
eGFR: 78 mL/min/{1.73_m2} (ref >59)

## 2020-12-07 LAB — LIPID PANEL
Chol/HDL Ratio: 4.5 ratio (ref 0.0–5.0)
Cholesterol, Total: 192 mg/dL (ref 100–199)
HDL: 43 mg/dL (ref >39)
LDL Chol Calc (NIH): 136 mg/dL — ABNORMAL HIGH (ref 0–99)
Triglycerides: 70 mg/dL (ref 0–149)
VLDL Cholesterol Cal: 13 mg/dL (ref 5–40)

## 2020-12-07 LAB — HEMOGLOBIN A1C
Est. average glucose Bld gHb Est-mCnc: 120 mg/dL
Hgb A1c MFr Bld: 5.8 % — ABNORMAL HIGH (ref 4.8–5.6)

## 2020-12-07 LAB — PSA: Prostate Specific Ag, Serum: 1 ng/mL (ref 0.0–4.0)

## 2020-12-07 LAB — VITAMIN D 25 HYDROXY (VIT D DEFICIENCY, FRACTURES): Vit D, 25-Hydroxy: 36.1 ng/mL (ref 30.0–100.0)

## 2020-12-07 LAB — TESTOSTERONE: Testosterone: 195 ng/dL — ABNORMAL LOW (ref 264–916)

## 2020-12-14 ENCOUNTER — Encounter: Payer: Self-pay | Admitting: Nurse Practitioner

## 2020-12-17 ENCOUNTER — Other Ambulatory Visit: Payer: Self-pay | Admitting: Nurse Practitioner

## 2020-12-17 DIAGNOSIS — R7989 Other specified abnormal findings of blood chemistry: Secondary | ICD-10-CM

## 2020-12-17 DIAGNOSIS — N529 Male erectile dysfunction, unspecified: Secondary | ICD-10-CM

## 2020-12-17 NOTE — Progress Notes (Signed)
Make him aware that I have referred him twice and they were unable to get in contact with him. I am referring him once again to [facility], he can also call them to schedule an appt. Due to his age it is best evaluated by a specialist for low testosterone.

## 2021-02-18 ENCOUNTER — Encounter: Payer: BC Managed Care – PPO | Admitting: Nurse Practitioner

## 2021-02-18 NOTE — Progress Notes (Deleted)
  Rich Brave [place]leton,acting as a Education administrator for [name], FNP.,have documented all relevant documentation on the behalf of [name], FNP,as directed by  [name], FNP while in the presence of [name], [place].  This visit occurred during the SARS-CoV-2 public health emergency.  Safety protocols were in place, including screening questions prior to the visit, additional usage of staff PPE, and extensive cleaning of exam room while observing appropriate contact time as indicated for disinfecting solutions.  Subjective:     Patient ID: Bruce Brooks. , male    DOB: Jun 10, 1975 , 45 y.o.   MRN: [id number]   Chief Complaint  Patient presents with   Weight Check    HPI  Patient presents today for a weight check     Past Medical History:  Diagnosis Date   Allergy    seasonal   GERD (gastroesophageal reflux disease)    Hyperlipidemia    OSA (obstructive sleep apnea)    Tobacco dependence    Vitamin D deficiency      Family History  Problem Relation Age of Onset   Colon polyps Father    Breast cancer Mother 40   Colon cancer Maternal Uncle 2     Current Outpatient Medications:    Vitamin D, Ergocalciferol, (DRISDOL) 1.25 MG (50000 UNIT) CAPS capsule, Take 1 capsule (50,000 Units total) by mouth 2 (two) times a week., Disp: 24 capsule, Rfl: 1   No Known Allergies   Review of Systems   There were no vitals filed for this visit. There is no height or weight on file to calculate BMI.   Objective:  Physical Exam      Assessment And Plan:     There are no diagnoses linked to this encounter.    Patient was given opportunity to ask questions. Patient verbalized understanding of the plan and was able to repeat key elements of the plan. All questions were answered to their satisfaction.  [name], CMA   I, [name], CMA, have reviewed all documentation for this visit. The documentation on 02/18/21 for the exam, diagnosis, procedures,  and orders are all accurate and complete.   IF YOU HAVE BEEN REFERRED TO A SPECIALIST, IT MAY TAKE 1-2 WEEKS TO SCHEDULE/PROCESS THE REFERRAL. IF YOU HAVE NOT HEARD FROM US/SPECIALIST IN TWO WEEKS, PLEASE GIVE [place] A CALL AT [phone].   THE PATIENT IS ENCOURAGED TO PRACTICE SOCIAL DISTANCING DUE TO THE COVID-19 PANDEMIC.

## 2021-02-21 NOTE — Progress Notes (Signed)
Not seen

## 2021-04-08 ENCOUNTER — Ambulatory Visit: Payer: BC Managed Care – PPO | Admitting: Nurse Practitioner

## 2021-04-25 ENCOUNTER — Encounter: Payer: Self-pay | Admitting: Neurology

## 2021-04-25 ENCOUNTER — Ambulatory Visit: Payer: BC Managed Care – PPO | Admitting: Neurology

## 2021-04-25 VITALS — BP 119/76 | HR 59 | Ht 68.0 in | Wt 255.1 lb

## 2021-04-25 DIAGNOSIS — G4733 Obstructive sleep apnea (adult) (pediatric): Secondary | ICD-10-CM

## 2021-04-25 DIAGNOSIS — Z9989 Dependence on other enabling machines and devices: Secondary | ICD-10-CM | POA: Diagnosis not present

## 2021-04-25 NOTE — Progress Notes (Signed)
CPAP orders sent to [facility].
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
## 2021-08-23 ENCOUNTER — Encounter: Payer: Self-pay | Admitting: Gastroenterology

## 2021-10-23 IMAGING — US US BREAST*L* LIMITED INC AXILLA
1 series · 2 of 2 positions shown · non-contrast
Comparison: None

CLINICAL DATA: Palpable abnormality associated with tenderness
behind the LEFT nipple. Symptoms for started in [REDACTED]. Patient
began some over the counter supplements in [REDACTED].

EXAM:
DIGITAL DIAGNOSTIC BILATERAL MAMMOGRAM WITH CAD AND TOMO
ULTRASOUND LEFT BREAST

[Series 1: us breast*left* limited inc axilla · 0.06mm/px · 2 of 2 slices shown]
[im 1/2]
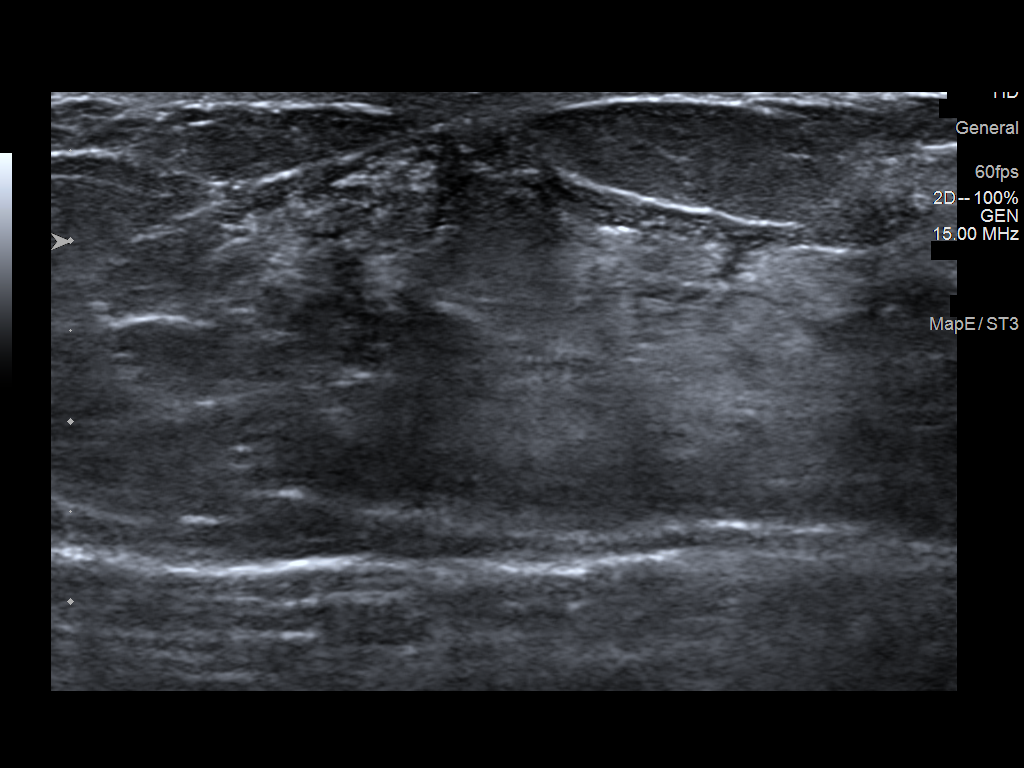
[im 2/2]
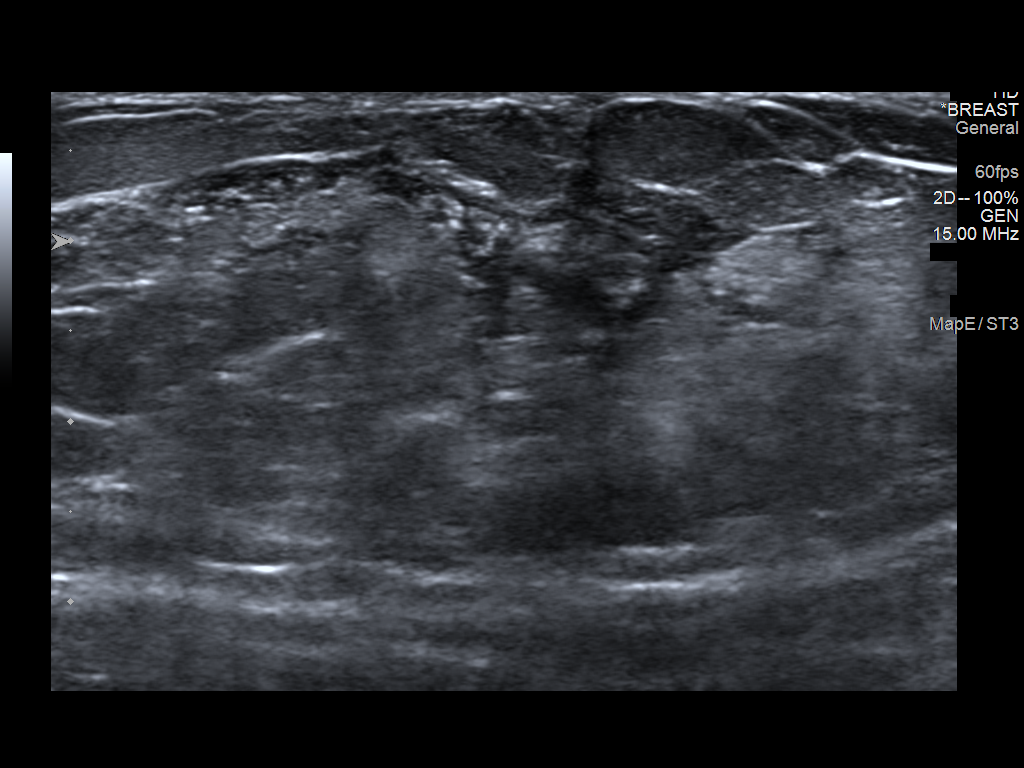

[2 of 2 positions shown; findings below may reference images not displayed]

ACR Breast Density Category b: There are scattered areas of
fibroglandular density.
FINDINGS: There is asymmetric fibroglandular tissue in the retroareolar region
of the LEFT breast, not associated with mass, distortion, or
suspicious microcalcifications. RIGHT breast is almost entirely
fatty.

Mammographic images were processed with CAD.

On physical exam, I palpate soft, slightly tender fullness in the
central portion of the LEFT breast. I palpate no discrete mass.

Targeted ultrasound is performed, showing retroareolar
fibroglandular tissue on the LEFT, and without mass or acoustic
shadowing.
IMPRESSION: Findings consistent benign gynecomastia, LEFT greater than RIGHT.

Gynecomastia is common, and can occur with changes in the
testosterone:estrogen ratio. We discussed potential etiologies
including numerous prescription medications and recreational drugs
(marijuana and anabolic steroids in particular). Approximately 2% of
patients with gynecomastia have testicular tumors. 20% of cases are
idiopathic.

RECOMMENDATION:
Recommend clinical follow-up as needed.

Recommend correlation with testicular exam and possible testicular
ultrasound. Consider evaluation of serum estradiol, alpha
fetoprotein, and human chorionic gonadotrophin levels. I counseled
the patient to perform breast self-examination. We also discussed
the possibility of surgical excision if symptoms become problematic.

I have discussed the findings and recommendations with the patient.
If applicable, a reminder letter will be sent to the patient
regarding the next appointment.

BI-RADS CATEGORY  2: Benign.

## 2021-12-16 ENCOUNTER — Encounter: Payer: BC Managed Care – PPO | Admitting: Nurse Practitioner

## 2022-05-06 NOTE — Progress Notes (Unsigned)
   Virtual Visit via Video Note  I connected with Bruce Brooks. on 05/06/22 at  1:45 PM EST by a video enabled telemedicine application and verified that I am speaking with the correct person using two identifiers.  Location: Patient: at his work  Provider: in the office    I discussed the limitations of evaluation and management by telemedicine and the availability of in person appointments. The patient expressed understanding and agreed to proceed.  History of Present Illness: 05/07/22 SS: Here today via VV, using CPAP excellent. Uses nasal pillow mask.  He started using his CPAP machine in 2018.  Review of CPAP download 04/06/22-05/05/22 shows 30/30 days usage greater than 4 hours 100%.  Average usage 6 hours 35 minutes.  Minimum pressure 7, maximum 15 cm water.  EPR level 3.  Leak 1.0, AHI 1.7.  Pressure 95th percentile 14.7, maximum 14.9.  Is very compliant with CPAP use. Feels like getting enough air.   04/25/21 SS: Bruce Brooks is here today for follow-up with history of OSA on CPAP.  His download shows overall excellent compliance 29/30 days of usage and greater than 4 hours.  Average usage days used 7 hours 7 minutes.  Minimum pressure 5, maximum pressure 15 cm water.  Leak in the 95th percentile 17.6, AHI is 2. He got married in December. Uses nasal mask, needs to replace it. The mask material changed a year ago, tickles his nose. Feeling well in the morning. 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    Observations/Objective: Via virtual visit, is alert and oriented, speech is clear and concise, moves about freely  Assessment and Plan: 1.  OSA on CPAP -Has excellent use, compliance is superb -We will continue current settings, he is eligible for a new machine, if he wishes to pursue I will order a new HST -Continue nightly use for greater than 4 hours  Follow Up Instructions: 1 year   I discussed the assessment and treatment plan with the patient. The patient was provided an  opportunity to ask questions and all were answered. The patient agreed with the plan and demonstrated an understanding of the instructions.   The patient was advised to call back or seek an in-person evaluation if the symptoms worsen or if the condition fails to improve as anticipated.  Evangeline Dakin, DNP  [facility] [address] [place], [place] [address] [phone]

## 2022-05-07 ENCOUNTER — Telehealth (INDEPENDENT_AMBULATORY_CARE_PROVIDER_SITE_OTHER): Payer: BC Managed Care – PPO | Admitting: Neurology

## 2022-05-07 ENCOUNTER — Encounter: Payer: Self-pay | Admitting: Neurology

## 2022-05-07 DIAGNOSIS — G4733 Obstructive sleep apnea (adult) (pediatric): Secondary | ICD-10-CM

## 2022-07-07 ENCOUNTER — Encounter: Payer: Self-pay | Admitting: Gastroenterology

## 2022-08-01 ENCOUNTER — Encounter: Payer: Self-pay | Admitting: Gastroenterology

## 2022-08-01 ENCOUNTER — Ambulatory Visit (AMBULATORY_SURGERY_CENTER): Payer: BC Managed Care – PPO

## 2022-08-01 VITALS — Ht 68.0 in | Wt 260.0 lb

## 2022-08-01 DIAGNOSIS — Z800 Family history of malignant neoplasm of digestive organs: Secondary | ICD-10-CM

## 2022-08-01 DIAGNOSIS — Z1211 Encounter for screening for malignant neoplasm of colon: Secondary | ICD-10-CM

## 2022-08-01 MED ORDER — NA SULFATE-K SULFATE-MG SULF 17.5-3.13-1.6 GM/177ML PO SOLN: 1.0000 | Freq: Once | ORAL | 0 refills | Status: AC

## 2022-08-01 NOTE — Progress Notes (Signed)
Pre visit completed via phone call; Patient verified name, DOB, and address;  No egg or soy allergy known to patient;  No issues known to pt with past sedation with any surgeries or procedures; Patient denies ever being told they had issues or difficulty with intubation;  No FH of Malignant Hyperthermia; Pt is not on diet pills; Pt is not on home 02;  Pt is not on blood thinners;  Pt denies issues with constipation;  No A fib or A flutter; Have any cardiac testing pending--NO Pt instructed to use Singlecare.com or GoodRx for a price reduction on prep;   Insurance verified during PV appt=[employer] [place]  Patient's chart reviewed by Cathlyn Parsons CNRA prior to previsit and patient appropriate for the [facility].  Previsit completed and red dot placed by patient's name on their procedure day (on provider's schedule).    Instructions printed as well as a GoodRx coupon and mailed to the patient per his request;

## 2022-08-29 ENCOUNTER — Encounter: Payer: Self-pay | Admitting: Gastroenterology

## 2022-08-29 ENCOUNTER — Ambulatory Visit (AMBULATORY_SURGERY_CENTER): Payer: BC Managed Care – PPO | Admitting: Gastroenterology

## 2022-08-29 VITALS — BP 106/52 | HR 65 | Temp 98.0°F | Resp 14 | Ht 68.0 in | Wt 260.0 lb

## 2022-08-29 DIAGNOSIS — D123 Benign neoplasm of transverse colon: Secondary | ICD-10-CM

## 2022-08-29 DIAGNOSIS — D124 Benign neoplasm of descending colon: Secondary | ICD-10-CM | POA: Diagnosis not present

## 2022-08-29 DIAGNOSIS — D120 Benign neoplasm of cecum: Secondary | ICD-10-CM

## 2022-08-29 DIAGNOSIS — D127 Benign neoplasm of rectosigmoid junction: Secondary | ICD-10-CM

## 2022-08-29 DIAGNOSIS — D125 Benign neoplasm of sigmoid colon: Secondary | ICD-10-CM | POA: Diagnosis not present

## 2022-08-29 DIAGNOSIS — K6389 Other specified diseases of intestine: Secondary | ICD-10-CM

## 2022-08-29 DIAGNOSIS — Z1211 Encounter for screening for malignant neoplasm of colon: Secondary | ICD-10-CM | POA: Diagnosis present

## 2022-08-29 DIAGNOSIS — K635 Polyp of colon: Secondary | ICD-10-CM

## 2022-08-29 MED ORDER — SODIUM CHLORIDE 0.9 % IV SOLN
500.0000 mL | Freq: Once | INTRAVENOUS | Status: DC
Start: 2022-08-29 — End: 2022-08-29

## 2022-08-29 NOTE — Op Note (Signed)
[facility] Patient Name: Bruce Brooks Procedure Date: 08/29/2022 1:16 PM MRN: [id number] Endoscopist: Corliss Parish , MD, [id number] Age: 47 Referring MD:  Date of Birth: 1976-01-22 Gender: Male Account #: [id number] Procedure:                Colonoscopy Indications:              Screening for colorectal malignant neoplasm Medicines:                Monitored Anesthesia Care Procedure:                Pre-Anesthesia Assessment:                           - Prior to the procedure, a History and Physical                            was performed, and patient medications and                            allergies were reviewed. The patient's tolerance of                            previous anesthesia was also reviewed. The risks                            and benefits of the procedure and the sedation                            options and risks were discussed with the patient.                            All questions were answered, and informed consent                            was obtained. Prior Anticoagulants: The patient has                            taken no anticoagulant or antiplatelet agents. ASA                            Grade Assessment: II - A patient with mild systemic                            disease. After reviewing the risks and benefits,                            the patient was deemed in satisfactory condition to                            undergo the procedure.                           After obtaining informed consent, the colonoscope  was passed under direct vision. Throughout the                            procedure, the patient's blood pressure, pulse, and                            oxygen saturations were monitored continuously. The                            CF [id number] #[id number] was introduced through the anus                            and advanced to the 3 cm into the ileum. The                            colonoscopy  was performed without difficulty. The                            patient tolerated the procedure. The quality of the                            bowel preparation was adequate. The terminal ileum,                            ileocecal valve, appendiceal orifice, and rectum                            were photographed. Scope In: 1:35:14 PM Scope Out: 1:56:33 PM Scope Withdrawal Time: 0 hours 18 minutes 59 seconds  Total Procedure Duration: 0 hours 21 minutes 19 seconds  Findings:                 The digital rectal exam findings include                            hemorrhoids. Pertinent negatives include no                            palpable rectal lesions.                           The terminal ileum and ileocecal valve appeared                            normal.                           12 sessile polyps were found in the recto-sigmoid                            colon (1), sigmoid colon (3), descending colon (2),                            transverse colon (1), hepatic flexure (4) and cecum                            (  1). The polyps were 2 to 6 mm in size. These                            polyps were removed with a cold snare. Resection                            and retrieval were complete.                           Normal mucosa was found in the entire colon                            otherwise.                           Anal papilla was hypertrophied, though there was                            some granularity to the tip of it that did not have                            overt papillomatous change but still wanted to rule                            this out. Biopsies were taken with a cold forceps                            for histology.                           Non-bleeding non-thrombosed internal hemorrhoids                            were found during retroflexion, during perianal                            exam and during digital exam. The hemorrhoids were                             Grade II (internal hemorrhoids that prolapse but                            reduce spontaneously). Complications:            No immediate complications. Estimated Blood Loss:     Estimated blood loss was minimal. Impression:               - Hemorrhoids found on digital rectal exam.                           - The examined portion of the ileum was normal.                           - 12, 2 to 6 mm polyps at the recto-sigmoid colon,  in the sigmoid colon, in the descending colon, in                            the transverse colon, at the hepatic flexure and in                            the cecum, removed with a cold snare. Resected and                            retrieved.                           - Normal mucosa in the entire examined colon                            otherwise.                           - Anal papilla was hypertrophied, with some                            granularity. Biopsied to rule out papilloma.                           - Non-bleeding non-thrombosed internal hemorrhoids. Recommendation:           - The patient will be observed post-procedure,                            until all discharge criteria are met.                           - Discharge patient to home.                           - Patient has a contact number available for                            emergencies. The signs and symptoms of potential                            delayed complications were discussed with the                            patient. Return to normal activities tomorrow.                            Written discharge instructions were provided to the                            patient.                           - High fiber diet.                           -  Use FiberCon 1-2 tablets PO daily.                           - Continue present medications.                           - Await pathology results.                           - If evidence of papilloma on  papilla biopsies,                            then will need colorectal surgery evaluation for                            excision.                           - Repeat colonoscopy in 1 year for surveillance                            based on pathology results if 10 of these polyps                            returned as adenomatous otherwise likely 3 or                            5-year follow-up.                           - The findings and recommendations were discussed                            with the patient.                           - The findings and recommendations were discussed                            with the patient's family. Corliss Parish, MD 08/29/2022 2:03:53 PM

## 2022-08-29 NOTE — Progress Notes (Signed)
Pt's states no medical or surgical changes since previsit or office visit. 

## 2022-08-29 NOTE — Progress Notes (Signed)
GASTROENTEROLOGY PROCEDURE H&P NOTE   Primary Care Physician: Arnette Felts, FNP  HPI: Bruce Brooks. is a 47 y.o. male who presents for colonoscopy for screening.  Past Medical History:  Diagnosis Date   GERD (gastroesophageal reflux disease)    with certain foods   Hyperlipidemia    OSA (obstructive sleep apnea)    Seasonal allergies    Sleep apnea    uses CPAP   Tobacco dependence    Vitamin D deficiency    History reviewed. No pertinent surgical history. Current Outpatient Medications  Medication Sig Dispense Refill   nicotine (NICODERM CQ - DOSED IN MG/24 HOURS) 14 mg/24hr patch Place 14 mg onto the skin daily. (Patient not taking: Reported on 08/01/2022)     Current Facility-Administered Medications  Medication Dose Route Frequency Provider Last Rate Last Admin   0.9 %  sodium chloride infusion  500 mL Intravenous Once Mansouraty, Netty Starring., MD        Current Outpatient Medications:    nicotine (NICODERM CQ - DOSED IN MG/24 HOURS) 14 mg/24hr patch, Place 14 mg onto the skin daily. (Patient not taking: Reported on 08/01/2022), Disp: , Rfl:   Current Facility-Administered Medications:    0.9 %  sodium chloride infusion, 500 mL, Intravenous, Once, Mansouraty, Netty Starring., MD No Known Allergies Family History  Problem Relation Age of Onset   Breast cancer Mother 78   Colon polyps Father 62   Colon polyps Maternal Uncle 60   Colon cancer Maternal Uncle 60   Esophageal cancer Neg Hx    Rectal cancer Neg Hx    Stomach cancer Neg Hx    Social History   Socioeconomic History   Marital status: Single    Spouse name: Not on file   Number of children: Not on file   Years of education: Not on file   Highest education level: Not on file  Occupational History   Not on file  Tobacco Use   Smoking status: Every Day    Packs/day: 0.50    Years: 22.00    Additional pack years: 0.00    Total pack years: 11.00    Types: Cigarettes   Smokeless tobacco:  Never   Tobacco comments:    smokes 7-8 cigarettes - just started chantix - he had a smoking cessation course. Now smoking 1/2 PPD. No longer taking chantix. Smoking approx 8 cig - 9/22  Substance and Sexual Activity   Alcohol use: Yes    Alcohol/week: 1.0 standard drink of alcohol    Types: 1 Cans of beer per week    Comment: occasional   Drug use: No   Sexual activity: Not on file  Other Topics Concern   Not on file  Social History Narrative   Not on file   Social Determinants of Health   Financial Resource Strain: Not on file  Food Insecurity: Not on file  Transportation Needs: Not on file  Physical Activity: Not on file  Stress: Not on file  Social Connections: Not on file  Intimate Partner Violence: Not on file    Physical Exam: Today's Vitals   08/29/22 1250  BP: (!) 115/54  Pulse: 70  Temp: 98 F (36.7 C)  TempSrc: Temporal  SpO2: 96%  Weight: 260 lb (117.9 kg)  Height: 5\' 8"  (1.727 m)   Body mass index is 39.53 kg/m. GEN: NAD EYE: Sclerae anicteric ENT: MMM CV: Non-tachycardic GI: Soft, NT/ND NEURO:  Alert & Oriented x 3  Lab Results: No  results for input(s): "WBC", "HGB", "HCT", "PLT" in the last 72 hours. BMET No results for input(s): "NA", "K", "CL", "CO2", "GLUCOSE", "BUN", "CREATININE", "CALCIUM" in the last 72 hours. LFT No results for input(s): "PROT", "ALBUMIN", "AST", "ALT", "ALKPHOS", "BILITOT", "BILIDIR", "IBILI" in the last 72 hours. PT/INR No results for input(s): "LABPROT", "INR" in the last 72 hours.   Impression / Plan: This is a 47 y.o.male who presents for colonoscopy for screening.   The risks and benefits of endoscopic evaluation/treatment were discussed with the patient and/or family; these include but are not limited to the risk of perforation, infection, bleeding, missed lesions, lack of diagnosis, severe illness requiring hospitalization, as well as anesthesia and sedation related illnesses.  The patient's history has been  reviewed, patient examined, no change in status, and deemed stable for procedure.  The patient and/or family is agreeable to proceed.    Corliss Parish, MD [facility] Office # [id number]

## 2022-08-29 NOTE — Progress Notes (Signed)
Called to room to assist during endoscopic procedure.  Patient ID and intended procedure confirmed with present staff. Received instructions for my participation in the procedure from the performing physician.  

## 2022-08-29 NOTE — Progress Notes (Signed)
PT taken to PACU. Monitors in place. VSS. Report given to RN. 

## 2022-08-29 NOTE — Patient Instructions (Addendum)
Thank you for coming in to see you today! Resume your diet and medications today. Return to regular daily activities tomorrow. Recommend high fiber diet and/or fiber supplements (FiberCon)1-2 tablets daily. Biopsy results will be available in 1-2 weeks at which time recommendation for a future surveillance colonoscopy will be made.   YOU HAD AN ENDOSCOPIC PROCEDURE TODAY AT THE [facility]:   Refer to the procedure report that was given to you for any specific questions about what was found during the examination.  If the procedure report does not answer your questions, please call your gastroenterologist to clarify.  If you requested that your care partner not be given the details of your procedure findings, then the procedure report has been included in a sealed envelope for you to review at your convenience later.  YOU SHOULD EXPECT: Some feelings of bloating in the abdomen. Passage of more gas than usual.  Walking can help get rid of the air that was put into your GI tract during the procedure and reduce the bloating. If you had a lower endoscopy (such as a colonoscopy or flexible sigmoidoscopy) you may notice spotting of blood in your stool or on the toilet paper. If you underwent a bowel prep for your procedure, you may not have a normal bowel movement for a few days.  Please Note:  You might notice some irritation and congestion in your nose or some drainage.  This is from the oxygen used during your procedure.  There is no need for concern and it should clear up in a day or so.  SYMPTOMS TO REPORT IMMEDIATELY:  Following lower endoscopy (colonoscopy or flexible sigmoidoscopy):  Excessive amounts of blood in the stool  Significant tenderness or worsening of abdominal pains  Swelling of the abdomen that is new, acute  Fever of 100F or higher    For urgent or emergent issues, a gastroenterologist can be reached at any hour by calling [phone]. Do not use MyChart  messaging for urgent concerns.    DIET:  We do recommend a small meal at first, but then you may proceed to your regular diet.  Drink plenty of fluids but you should avoid alcoholic beverages for 24 hours.  ACTIVITY:  You should plan to take it easy for the rest of today and you should NOT DRIVE or use heavy machinery until tomorrow (because of the sedation medicines used during the test).    FOLLOW UP: Our staff will call the number listed on your records the next business day following your procedure.  We will call around 7:15- 8:00 am to check on you and address any questions or concerns that you may have regarding the information given to you following your procedure. If we do not reach you, we will leave a message.     If any biopsies were taken you will be contacted by phone or by letter within the next 1-3 weeks.  Please call us at [phone] if you have not heard about the biopsies in 3 weeks.    SIGNATURES/CONFIDENTIALITY: You and/or your care partner have signed paperwork which will be entered into your electronic medical record.  These signatures attest to the fact that that the information above on your After Visit Summary has been reviewed and is understood.  Full responsibility of the confidentiality of this discharge information lies with you and/or your care-partner.

## 2022-09-01 ENCOUNTER — Telehealth: Payer: Self-pay

## 2022-09-01 NOTE — Telephone Encounter (Signed)
Attempted to reach patient for post-procedure follow up call. No answer. Left message for him to please not hesitate to call if he has any questions/concerns regarding his care. 

## 2022-09-21 ENCOUNTER — Encounter: Payer: Self-pay | Admitting: Gastroenterology

## 2022-11-27 IMAGING — US US SCROTUM
1 series · 14 of 25 positions shown · non-contrast
Comparison: None.

CLINICAL DATA: Gynecomastia

EXAM:
ULTRASOUND OF SCROTUM
TECHNIQUE: Complete ultrasound examination of the testicles, epididymis, and
other scrotal structures was performed.

[Series 1: us scrotum · 0.05mm/px · 14 of 51 slices shown]
[im 1/51]
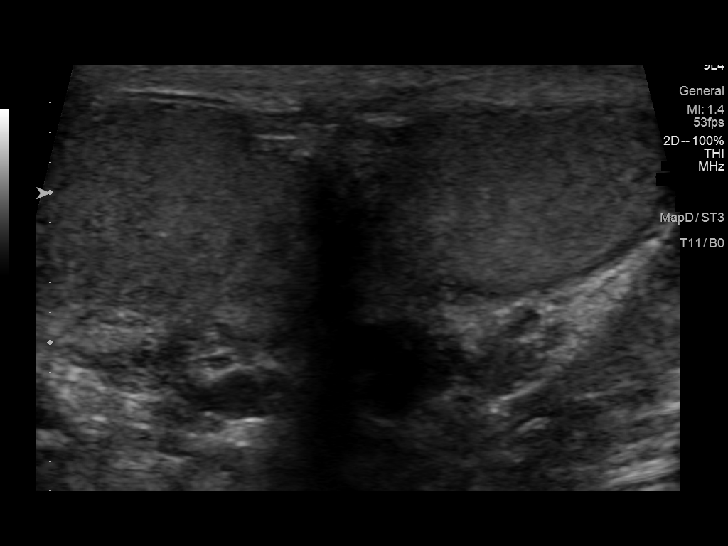
[im 5/51]
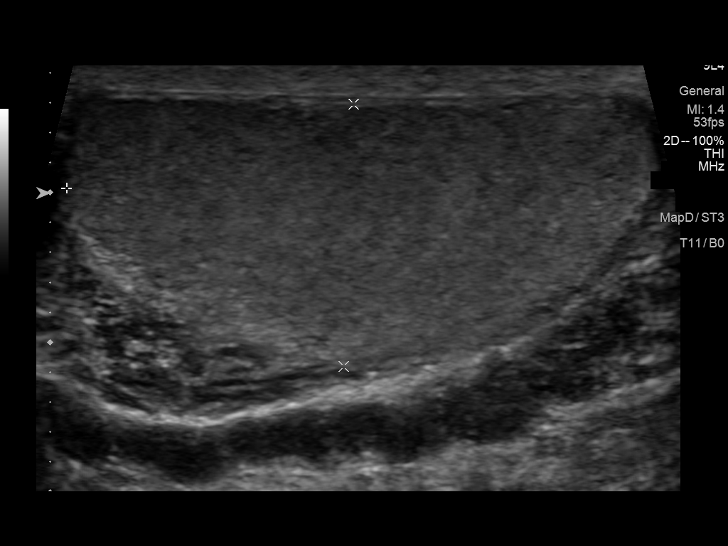
[im 9/51]
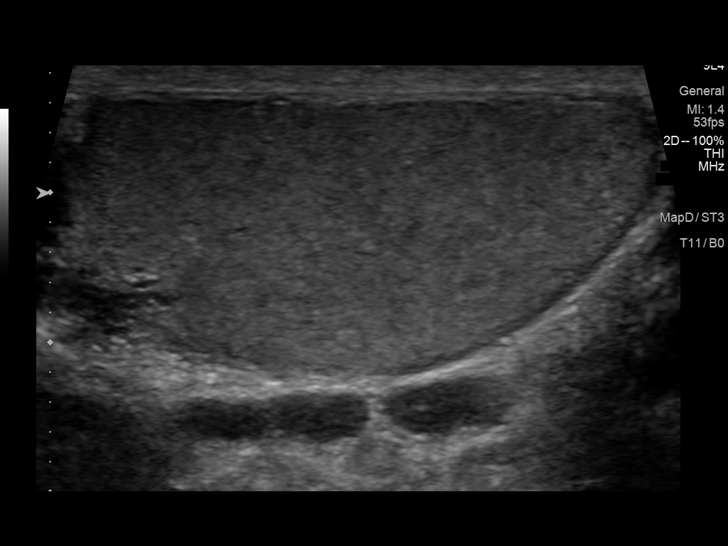
[im 13/51]
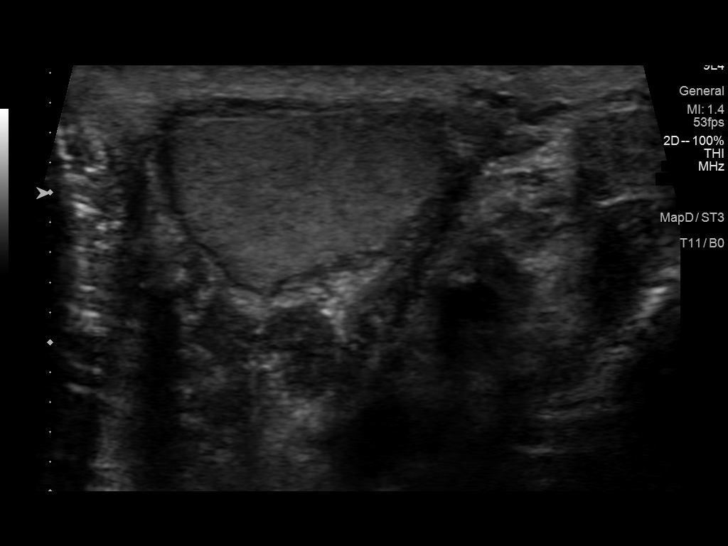
[im 17/51]
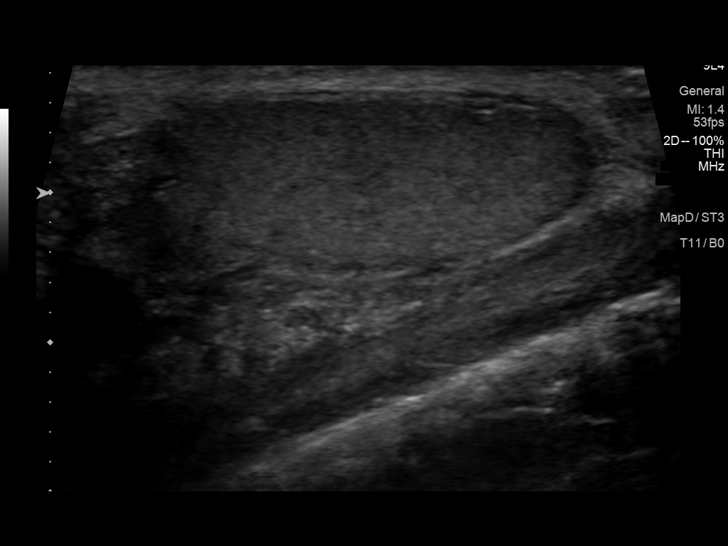
[im 19/51]
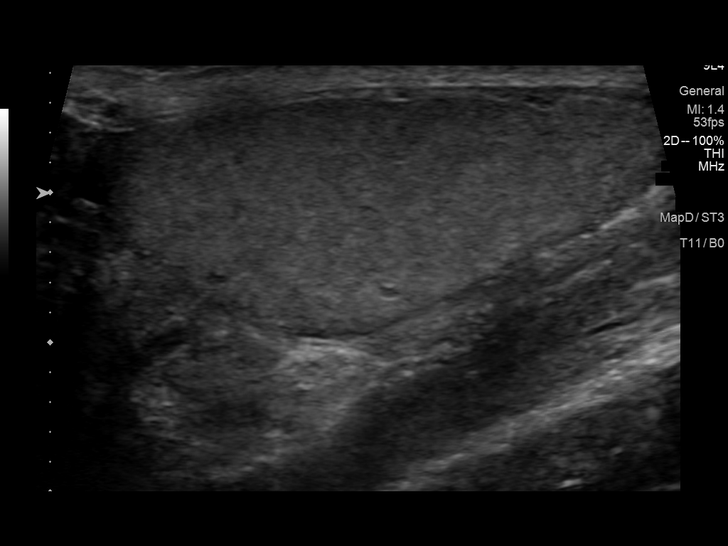
[im 23/51]
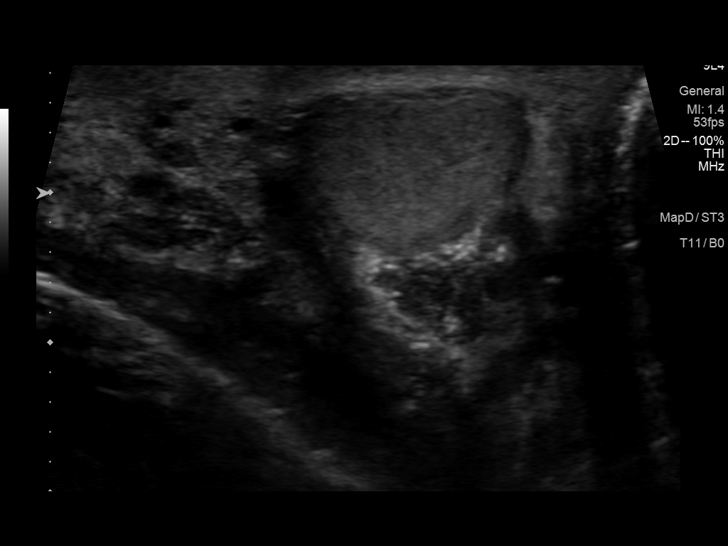
[im 28/51]
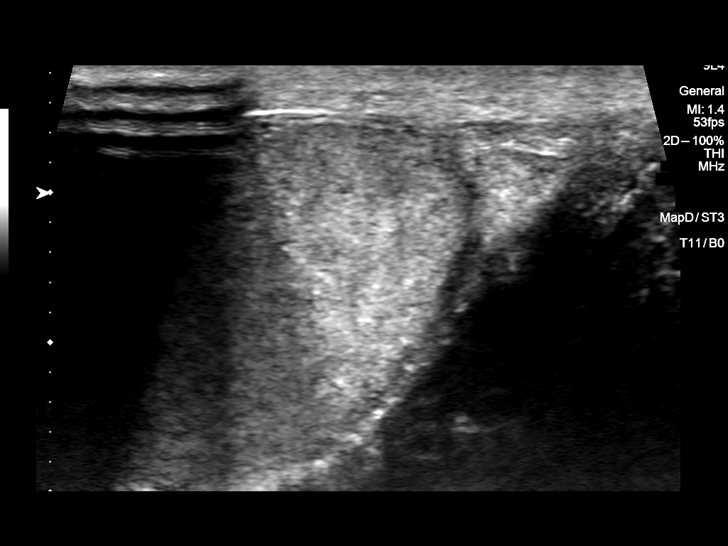
[im 32/51]
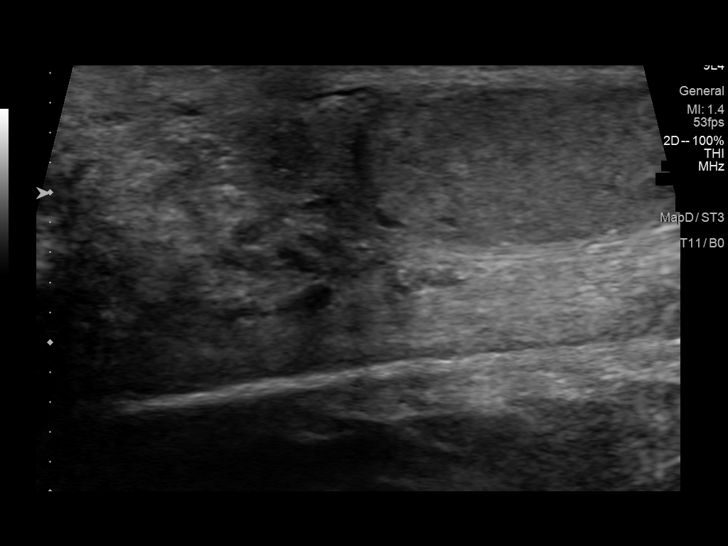
[im 34/51]
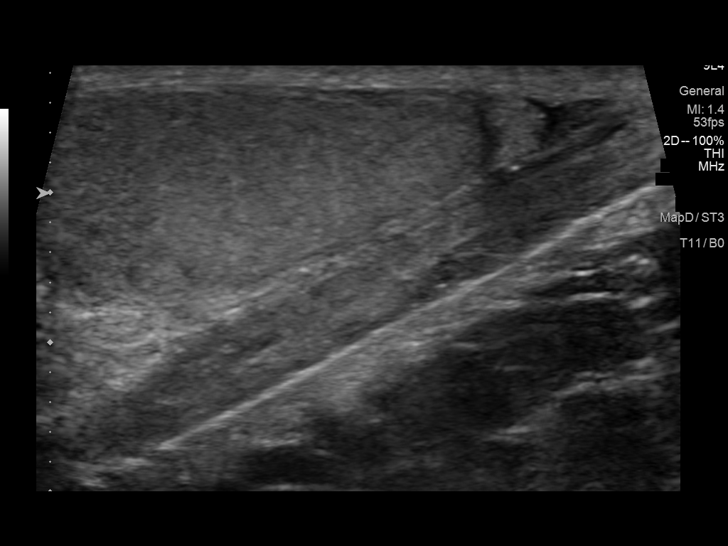
[im 38/51]
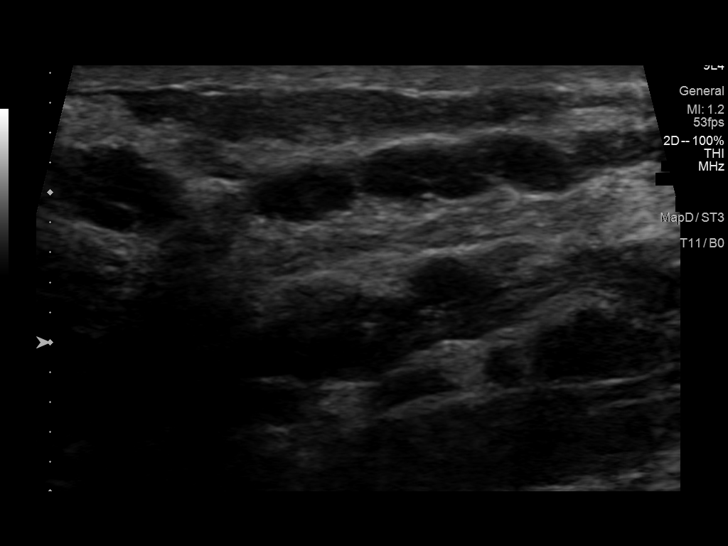
[im 42/51]
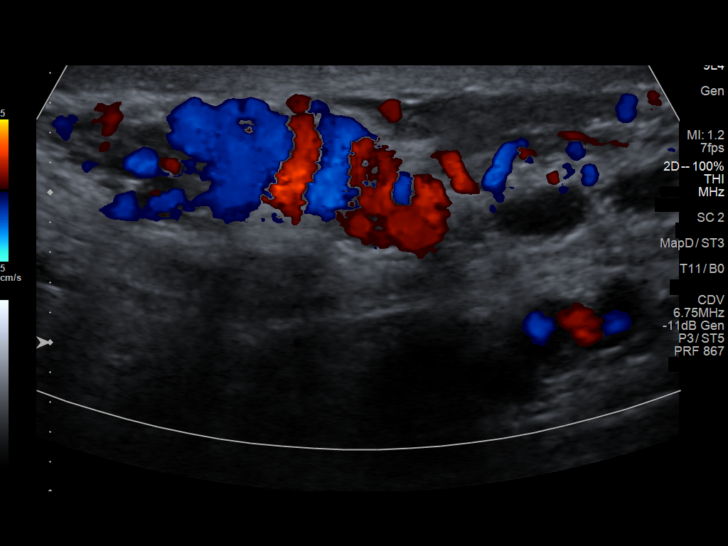
[im 46/51]
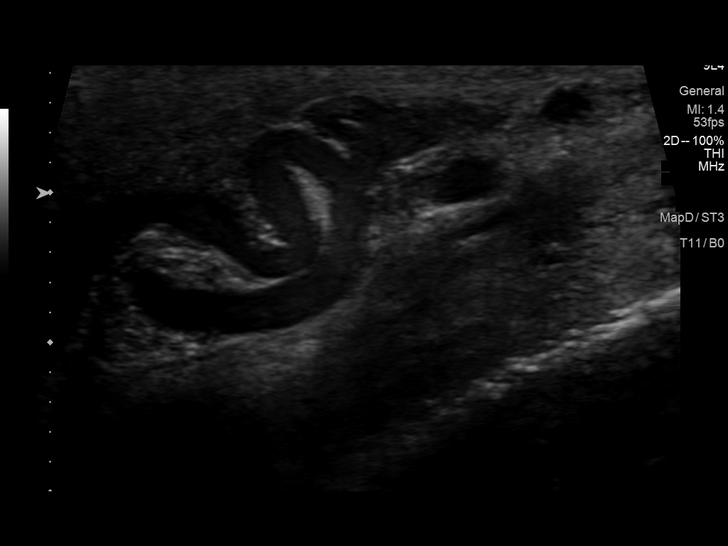
[im 51/51]
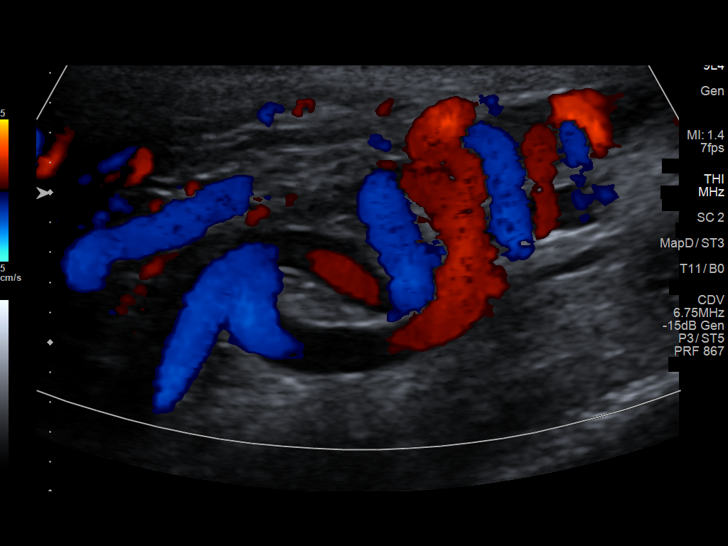

[14 of 25 positions shown; findings below may reference images not displayed]

FINDINGS: Right testicle

Measurements: 3.9 x 1.8 x 2.7 cm. No mass or microlithiasis
visualized.

Left testicle

Measurements: 3.5 x 1.6 x 2.2 cm. No mass or microlithiasis
visualized.

Right epididymis:  Normal in size and appearance.

Left epididymis:  Normal in size and appearance.

Hydrocele:  None visualized.

Varicocele:  Bilateral varicoceles.
IMPRESSION: Bilateral varicocele, correlate with physical exam findings.

No testicular mass or other concerning scrotal abnormality.

## 2022-12-17 ENCOUNTER — Encounter: Payer: Self-pay | Admitting: Family Medicine

## 2022-12-17 ENCOUNTER — Ambulatory Visit: Payer: BC Managed Care – PPO | Admitting: Family Medicine

## 2022-12-17 VITALS — BP 120/80 | HR 57 | Temp 98.5°F | Ht 68.0 in | Wt 261.0 lb

## 2022-12-17 DIAGNOSIS — Z83438 Family history of other disorder of lipoprotein metabolism and other lipidemia: Secondary | ICD-10-CM

## 2022-12-17 DIAGNOSIS — R7309 Other abnormal glucose: Secondary | ICD-10-CM

## 2022-12-17 DIAGNOSIS — Z0000 Encounter for general adult medical examination without abnormal findings: Secondary | ICD-10-CM | POA: Diagnosis not present

## 2022-12-17 DIAGNOSIS — E669 Obesity, unspecified: Secondary | ICD-10-CM

## 2022-12-17 DIAGNOSIS — N529 Male erectile dysfunction, unspecified: Secondary | ICD-10-CM

## 2022-12-17 DIAGNOSIS — R202 Paresthesia of skin: Secondary | ICD-10-CM | POA: Diagnosis not present

## 2022-12-17 DIAGNOSIS — R7989 Other specified abnormal findings of blood chemistry: Secondary | ICD-10-CM

## 2022-12-17 DIAGNOSIS — E66812 Obesity, class 2: Secondary | ICD-10-CM

## 2022-12-17 DIAGNOSIS — Z6839 Body mass index (BMI) 39.0-39.9, adult: Secondary | ICD-10-CM

## 2022-12-17 NOTE — Progress Notes (Signed)
Madelaine Bhat, CMA,acting as a Neurosurgeon for Ellender Hose, NP.,have documented all relevant documentation on the behalf of Ellender Hose, NP,as directed by  Ellender Hose, NP while in the presence of Ellender Hose, NP.  Subjective:   Patient ID: Bruce Brooks , male    DOB: [birth year] , 47 y.o.   MRN: [id number]  Chief Complaint  Patient presents with   Annual Exam    HPI  Patient presents today for routine annual check up. Patient denies any chest pain, SOB, or headaches. Patient reports his back has been hurting  and he also reports that he has been noticing tingling in his left thigh and arms. He states that  the pain is in his lower back  is achy and throbbing.  BP Readings from Last 3 Encounters: 12/17/22 : 120/80 08/29/22 : (!) 106/52 04/25/21 : 119/76        Past Medical History:  Diagnosis Date   GERD (gastroesophageal reflux disease)    with certain foods   Hyperlipidemia    OSA (obstructive sleep apnea)    Seasonal allergies    Sleep apnea    uses CPAP   Tobacco dependence    Vitamin D deficiency      Family History  Problem Relation Age of Onset   Breast cancer Mother 77   Colon polyps Father 74   Colon polyps Maternal Uncle 60   Colon cancer Maternal Uncle 60   Esophageal cancer Neg Hx    Rectal cancer Neg Hx    Stomach cancer Neg Hx      Current Outpatient Medications:    nicotine (NICODERM CQ - DOSED IN MG/24 HOURS) 14 mg/24hr patch, Place 14 mg onto the skin daily., Disp: , Rfl:    No Known Allergies   Men's preventive visit. Patient Health Questionnaire (PHQ-2) is  Flowsheet Row Office Visit from 12/17/2022 in [facility]  PHQ-2 Total Score 0      Social History   Substance and Sexual Activity  Alcohol Use Yes   Alcohol/week: 1.0 standard drink of alcohol   Types: 1 Cans of beer per week   Comment: occasional  . Relevant history for tobacco use is:  Social History   Tobacco Use  Smoking Status  Every Day   Current packs/day: 0.50   Average packs/day: 0.5 packs/day for 22.0 years (11.0 ttl pk-yrs)   Types: Cigarettes  Smokeless Tobacco Never  Tobacco Comments   smokes 7-8 cigarettes - just started chantix - he had a smoking cessation course. Now smoking 1/2 PPD. No longer taking chantix. Smoking approx 8 cig - 9/22  .   Review of Systems  Constitutional: Negative.   HENT: Negative.    Eyes: Negative.   Respiratory: Negative.    Cardiovascular: Negative.   Gastrointestinal: Negative.   Endocrine: Negative.   Genitourinary: Negative.   Musculoskeletal: Negative.   Skin: Negative.   Allergic/Immunologic: Negative.   Neurological:        Tingling in extremities  Hematological: Negative.   Psychiatric/Behavioral: Negative.       Today's Vitals   12/17/22 1146  BP: 120/80  Pulse: (!) 57  Temp: 98.5 F (36.9 C)  TempSrc: Oral  Weight: 261 lb (118.4 kg)  Height: 5\' 8"  (1.727 m)  PainSc: 0-No pain   Body mass index is 39.68 kg/m.  Wt Readings from Last 3 Encounters:  12/17/22 261 lb (118.4 kg)  08/29/22 260 lb (117.9 kg)  08/01/22 260 lb (117.9  kg)    Objective:  Physical Exam HENT:     Head: Normocephalic.  Cardiovascular:     Rate and Rhythm: Bradycardia present.  Pulmonary:     Effort: Pulmonary effort is normal.     Breath sounds: Normal breath sounds.  Abdominal:     General: Bowel sounds are normal.  Musculoskeletal:        General: Normal range of motion.     Comments: Tingling in extremities  Skin:    General: Skin is warm and dry.  Neurological:     General: No focal deficit present.     Mental Status: He is alert.  Psychiatric:        Mood and Affect: Mood normal.         Assessment And Plan:    Encounter for annual health examination -     CBC with Differential/Platelet -     CMP14+EGFR  Tingling in extremities Assessment & Plan: Refused Gabapentin offer.Referred to Neurology.  Orders: -     Ambulatory referral to  Neurology  Abnormal glucose -     Hemoglobin A1c  Family history of hyperlipidemia -     Lipid panel  Class 2 severe obesity due to excess calories with serious comorbidity and body mass index (BMI) of 39.0 to 39.9 in adult [facility]) Assessment & Plan: He is encouraged to strive for BMI less than 30 to decrease cardiac risk. Advised to aim for at least 150 minutes of exercise per week.       Return for 1 year physical. Patient was given opportunity to ask questions. Patient verbalized understanding of the plan and was able to repeat key elements of the plan. All questions were answered to their satisfaction.   Ellender Hose, NP  I, Ellender Hose, NP, have reviewed all documentation for this visit. The documentation on 12/23/22 for the exam, diagnosis, procedures, and orders are all accurate and complete.   Marland Kitchen

## 2022-12-18 LAB — CBC WITH DIFFERENTIAL/PLATELET
Basophils Absolute: 0 10*3/uL (ref 0.0–0.2)
Basos: 1 %
EOS (ABSOLUTE): 0.3 10*3/uL (ref 0.0–0.4)
Eos: 4 %
Hematocrit: 43.9 % (ref 37.5–51.0)
Hemoglobin: 14.4 g/dL (ref 13.0–17.7)
Immature Grans (Abs): 0 10*3/uL (ref 0.0–0.1)
Immature Granulocytes: 0 %
Lymphocytes Absolute: 3 10*3/uL (ref 0.7–3.1)
Lymphs: 44 %
MCH: 29.5 pg (ref 26.6–33.0)
MCHC: 32.8 g/dL (ref 31.5–35.7)
MCV: 90 fL (ref 79–97)
Monocytes Absolute: 0.6 10*3/uL (ref 0.1–0.9)
Monocytes: 10 %
Neutrophils Absolute: 2.8 10*3/uL (ref 1.4–7.0)
Neutrophils: 41 %
Platelets: 224 10*3/uL (ref 150–450)
RBC: 4.88 x10E6/uL (ref 4.14–5.80)
RDW: 14 % (ref 11.6–15.4)
WBC: 6.7 10*3/uL (ref 3.4–10.8)

## 2022-12-18 LAB — CMP14+EGFR
ALT: 23 [IU]/L (ref 0–44)
AST: 17 [IU]/L (ref 0–40)
Albumin: 4.3 g/dL (ref 4.1–5.1)
Alkaline Phosphatase: 59 [IU]/L (ref 44–121)
BUN/Creatinine Ratio: 10 (ref 9–20)
BUN: 11 mg/dL (ref 6–24)
Bilirubin Total: <0.2 mg/dL (ref 0.0–1.2)
CO2: 21 mmol/L (ref 20–29)
Calcium: 9.1 mg/dL (ref 8.7–10.2)
Chloride: 106 mmol/L (ref 96–106)
Creatinine, Ser: 1.08 mg/dL (ref 0.76–1.27)
Globulin, Total: 2.7 g/dL (ref 1.5–4.5)
Glucose: 66 mg/dL — ABNORMAL LOW (ref 70–99)
Potassium: 4.5 mmol/L (ref 3.5–5.2)
Sodium: 141 mmol/L (ref 134–144)
Total Protein: 7 g/dL (ref 6.0–8.5)
eGFR: 85 mL/min/{1.73_m2} (ref >59)

## 2022-12-18 LAB — LIPID PANEL
Chol/HDL Ratio: 4.5 {ratio} (ref 0.0–5.0)
Cholesterol, Total: 194 mg/dL (ref 100–199)
HDL: 43 mg/dL (ref >39)
LDL Chol Calc (NIH): 136 mg/dL — ABNORMAL HIGH (ref 0–99)
Triglycerides: 83 mg/dL (ref 0–149)
VLDL Cholesterol Cal: 15 mg/dL (ref 5–40)

## 2022-12-18 LAB — HEMOGLOBIN A1C
Est. average glucose Bld gHb Est-mCnc: 120 mg/dL
Hgb A1c MFr Bld: 5.8 % — ABNORMAL HIGH (ref 4.8–5.6)

## 2022-12-25 DIAGNOSIS — E66812 Obesity, class 2: Secondary | ICD-10-CM | POA: Insufficient documentation

## 2022-12-25 DIAGNOSIS — R202 Paresthesia of skin: Secondary | ICD-10-CM | POA: Insufficient documentation

## 2022-12-25 NOTE — Assessment & Plan Note (Signed)
He is encouraged to strive for BMI less than 30 to decrease cardiac risk. Advised to aim for at least 150 minutes of exercise per week.  

## 2022-12-25 NOTE — Assessment & Plan Note (Signed)
Refused Gabapentin offer.Referred to Neurology.

## 2023-01-27 NOTE — Progress Notes (Unsigned)
Bruce Brooks, CMA,acting as a Neurosurgeon for Bruce Felts, FNP.,have documented all relevant documentation on the behalf of Bruce Felts, FNP,as directed by  Bruce Felts, FNP while in the presence of Bruce Felts, FNP.  Subjective:  Patient ID: Bruce Brooks , male    DOB: November 19, 1975 , 47 y.o.   MRN: [id number]  No chief complaint on file.   HPI  Patient presents today for smoking     Past Medical History:  Diagnosis Date  . GERD (gastroesophageal reflux disease)    with certain foods  . Hyperlipidemia   . OSA (obstructive sleep apnea)   . Seasonal allergies   . Sleep apnea    uses CPAP  . Tobacco dependence   . Vitamin D deficiency      Family History  Problem Relation Age of Onset  . Breast cancer Mother 29  . Colon polyps Father 83  . Colon polyps Maternal Uncle 60  . Colon cancer Maternal Uncle 60  . Esophageal cancer Neg Hx   . Rectal cancer Neg Hx   . Stomach cancer Neg Hx      Current Outpatient Medications:  .  nicotine (NICODERM CQ - DOSED IN MG/24 HOURS) 14 mg/24hr patch, Place 14 mg onto the skin daily., Disp: , Rfl:    No Known Allergies   Review of Systems   There were no vitals filed for this visit. There is no height or weight on file to calculate BMI.  Wt Readings from Last 3 Encounters:  12/17/22 261 lb (118.4 kg)  08/29/22 260 lb (117.9 kg)  08/01/22 260 lb (117.9 kg)    The 10-year ASCVD risk score (Arnett DK, et al., 2019) is: 7.2%   Values used to calculate the score:     Age: 45 years     Sex: Male     Is Non-Hispanic African American: Yes     Diabetic: No     Tobacco smoker: Yes     Systolic Blood Pressure: 120 mmHg     Is BP treated: No     HDL Cholesterol: 43 mg/dL     Total Cholesterol: 194 mg/dL  Objective:  Physical Exam      Assessment And Plan:  Tobacco use disorder    No follow-ups on file.  Patient was given opportunity to ask questions. Patient verbalized understanding of the plan and was able to  repeat key elements of the plan. All questions were answered to their satisfaction.    Bruce Sparrow, FNP, have reviewed all documentation for this visit. The documentation on 01/27/23 for the exam, diagnosis, procedures, and orders are all accurate and complete.   IF YOU HAVE BEEN REFERRED TO A SPECIALIST, IT MAY TAKE 1-2 WEEKS TO SCHEDULE/PROCESS THE REFERRAL. IF YOU HAVE NOT HEARD FROM US/SPECIALIST IN TWO WEEKS, PLEASE GIVE [place] A CALL AT [phone].

## 2023-01-28 ENCOUNTER — Encounter: Payer: Self-pay | Admitting: Nurse Practitioner

## 2023-01-28 ENCOUNTER — Ambulatory Visit: Payer: Self-pay | Admitting: Nurse Practitioner

## 2023-01-28 VITALS — BP 90/60 | HR 58 | Temp 97.8°F | Ht 68.0 in | Wt 262.0 lb

## 2023-01-28 DIAGNOSIS — Z716 Tobacco abuse counseling: Secondary | ICD-10-CM | POA: Diagnosis not present

## 2023-01-28 DIAGNOSIS — Z2821 Immunization not carried out because of patient refusal: Secondary | ICD-10-CM | POA: Diagnosis not present

## 2023-01-28 DIAGNOSIS — F17200 Nicotine dependence, unspecified, uncomplicated: Secondary | ICD-10-CM

## 2023-01-28 MED ORDER — NICOTINE 21 MG/24HR TD PT24
21.0000 mg | MEDICATED_PATCH | TRANSDERMAL | 1 refills | Status: AC
Start: 2023-01-28 — End: ?

## 2023-01-28 NOTE — Assessment & Plan Note (Signed)
He is ready to quit smoking.  Will start him on nicotine patches.  His goal is to quit smoking within 3 months.  Will bring him back for follow-up in 6 weeks.  At this time he is not interested in a smoking cessation program.  Will revisit at his follow-up.

## 2023-01-28 NOTE — Patient Instructions (Signed)
Will f/u 6 weeks to see progress.

## 2023-01-28 NOTE — Assessment & Plan Note (Signed)
Smoking cessation instruction/counseling given:  counseled patient on the dangers of tobacco use, advised patient to stop smoking, and reviewed strategies to maximize success 

## 2023-01-28 NOTE — Assessment & Plan Note (Signed)

## 2023-03-23 ENCOUNTER — Ambulatory Visit: Payer: 59 | Admitting: Nurse Practitioner

## 2023-03-23 NOTE — Progress Notes (Deleted)
 LILLETTE Kristeen JINNY Gladis, CMA,acting as a neurosurgeon for Gaines Ada, FNP.,have documented all relevant documentation on the behalf of Gaines Ada, FNP,as directed by  Gaines Ada, FNP while in the presence of Gaines Ada, FNP.  Subjective:  Patient ID: Bruce Brooks , male    DOB: 02/25/1976 , 48 y.o.   MRN: [id number]  No chief complaint on file.   HPI  Patient presents today for a smoking follow up, Patient reports compliance with medication. Patient denies any chest pain, SOB, or headaches. Patient has no concerns today.     Past Medical History:  Diagnosis Date  . GERD (gastroesophageal reflux disease)    with certain foods  . Hyperlipidemia   . OSA (obstructive sleep apnea)   . Seasonal allergies   . Sleep apnea    uses CPAP  . Tobacco dependence   . Vitamin D  deficiency      Family History  Problem Relation Age of Onset  . Breast cancer Mother 58  . Colon polyps Father 32  . Colon polyps Maternal Uncle 60  . Colon cancer Maternal Uncle 60  . Esophageal cancer Neg Hx   . Rectal cancer Neg Hx   . Stomach cancer Neg Hx      Current Outpatient Medications:  .  nicotine  (NICODERM CQ  - DOSED IN MG/24 HOURS) 21 mg/24hr patch, Place 1 patch (21 mg total) onto the skin daily., Disp: 30 patch, Rfl: 1   No Known Allergies   Review of Systems   There were no vitals filed for this visit. There is no height or weight on file to calculate BMI.  Wt Readings from Last 3 Encounters:  01/28/23 262 lb (118.8 kg)  12/17/22 261 lb (118.4 kg)  08/29/22 260 lb (117.9 kg)    The 10-year ASCVD risk score (Arnett DK, et al., 2019) is: 4.4%   Values used to calculate the score:     Age: 37 years     Sex: Male     Is Non-Hispanic African American: Yes     Diabetic: No     Tobacco smoker: Yes     Systolic Blood Pressure: 90 mmHg     Is BP treated: No     HDL Cholesterol: 43 mg/dL     Total Cholesterol: 194 mg/dL  Objective:  Physical Exam      Assessment And Plan:   Tobacco use disorder    No follow-ups on file.  Patient was given opportunity to ask questions. Patient verbalized understanding of the plan and was able to repeat key elements of the plan. All questions were answered to their satisfaction.    LILLETTE Gaines Ada, FNP, have reviewed all documentation for this visit. The documentation on 03/23/23 for the exam, diagnosis, procedures, and orders are all accurate and complete.   IF YOU HAVE BEEN REFERRED TO A SPECIALIST, IT MAY TAKE 1-2 WEEKS TO SCHEDULE/PROCESS THE REFERRAL. IF YOU HAVE NOT HEARD FROM US /SPECIALIST IN TWO WEEKS, PLEASE GIVE US  A CALL AT [phone].

## 2023-03-30 ENCOUNTER — Ambulatory Visit: Payer: 59 | Admitting: Neurology

## 2023-03-30 ENCOUNTER — Institutional Professional Consult (permissible substitution): Payer: Self-pay | Admitting: Neurology

## 2023-03-30 ENCOUNTER — Encounter: Payer: Self-pay | Admitting: Neurology

## 2023-03-30 VITALS — BP 135/75 | HR 65 | Ht 67.0 in | Wt 269.5 lb

## 2023-03-30 DIAGNOSIS — E66813 Obesity, class 3: Secondary | ICD-10-CM | POA: Insufficient documentation

## 2023-03-30 DIAGNOSIS — G5623 Lesion of ulnar nerve, bilateral upper limbs: Secondary | ICD-10-CM | POA: Insufficient documentation

## 2023-03-30 DIAGNOSIS — E661 Drug-induced obesity: Secondary | ICD-10-CM

## 2023-03-30 DIAGNOSIS — G4733 Obstructive sleep apnea (adult) (pediatric): Secondary | ICD-10-CM | POA: Diagnosis not present

## 2023-03-30 DIAGNOSIS — R208 Other disturbances of skin sensation: Secondary | ICD-10-CM | POA: Diagnosis not present

## 2023-03-30 DIAGNOSIS — Z6841 Body Mass Index (BMI) 40.0 and over, adult: Secondary | ICD-10-CM

## 2023-03-30 NOTE — Patient Instructions (Signed)
 Pitcher's Elbow Rehab Ask your health care provider which exercises are safe for you. Do exercises exactly as told by your provider and adjust them as told. It is normal to feel mild stretching, pulling, tightness, or discomfort as you do these exercises. Stop right away if you feel sudden pain or your pain gets worse. Do not begin these exercises until told by your provider. Stretching and range-of-motion exercises These exercises warm up your muscles and joints and improve the movement and flexibility of your elbow. The exercises also help to relieve pain and stiffness. Wrist extension  Move your left / right arm out in front of you and turn your palm up toward the ceiling. If told by your provider, bend your left / right elbow to a 90 degree angle (right angle) at your side. Using your uninjured hand, gently press over the palm of your left / right hand to bend your wrist and fingers toward the floor (extension). Go as far as you can to feel a stretch without causing pain. Hold this position for _________ seconds. Return to the starting position. Repeat __________ times. Complete this exercise __________ times a day. Wrist flexion  Move your left / right arm out in front of you and turn your palm down toward the floor. If told by your provider, bend your left / right elbow to a 90 degree angle (right angle) at your side. Using your uninjured hand, gently press over the back of your left / right hand to bend your wrist and fingers toward the floor (flexion). Go as far as you can to feel a stretch without causing pain. Hold this position for _________ seconds. Return to the starting position. Repeat __________ times. Complete this exercise __________ times a day. Forearm rotation, supination  Sit with your left / right elbow bent to a 90 degree angle (right angle). Position your forearm so that the thumb is facing the ceiling (neutral position). Turn (rotate) your palm up toward the  ceiling (supination), stopping when you feel a gentle stretch. If told by your provider, use your other hand to gently push your palm up a little more. Hold this position for __________ seconds. Return to the starting position. Repeat __________ times. Complete this exercise __________ times a day. Forearm rotation, pronation  Sit with your left / right elbow bent to a 90 degree angle (right angle). Position your forearm so that the thumb is facing the ceiling (neutral position). Turn (rotate) your palm down toward the floor (pronation), stopping when you feel a gentle stretch. If told by your provider, use your other hand to gently push your palm down a little more. Hold this position for __________ seconds. Return to the starting position. Repeat __________ times. Complete this exercise __________ times a day. Elbow flexion and extension  Sit in a chair without armrests or stand up. Move your left / right arm out in front of you with your palm facing up. Maintain good posture during the exercise. Gently bend your left / right elbow so your palm comes toward your shoulder (flexion). Gently straighten your left / right elbow back to the starting position (extension). Avoid fully straightening or bending your elbow if: It is painful. Your provider told you not to move it that far. Repeat __________ times. Complete this exercise __________ times a day. Shoulder internal rotation stretch  Place your left / right hand behind your back with your palm facing up. Use your other hand to dangle a towel or rod  over your shoulder. Grasp the towel or rod with your left / right hand so you are holding on to both ends. Gently pull up on the towel or rod until you feel a stretch in your left / right shoulder (internal rotation). Avoid shrugging your shoulder as you raise your arm. Keep your shoulder blade tucked down toward the middle of your back. Hold this position for __________ seconds. Release the  stretch by letting go of the towel or rod and lowering your hand back to resting position. Repeat __________ times. Complete this exercise __________ times a day. Strengthening exercises These exercises build strength and endurance in your elbow. Endurance is the ability to use your muscles for a long time, even after they get tired. Wrist flexion, eccentric  Sit with your left / right forearm palm-up and supported on a table or other surface. Let your left / right wrist extend over the edge of the table. Hold a ________ weight or a rubber exercise band or tubing in your left / right hand. Using your uninjured hand, lift your left / right wrist up toward the ceiling. Release your left / right hand and begin to very slowly lower it toward the floor. Lowering the hand under tension is called eccentric flexion. When you have gone as far as you can, use the uninjured hand to lift the left / right wrist up toward the ceiling again. Repeat __________ times. Complete this exercise __________ times a day. Wrist extension, eccentric  Sit with your left / right forearm palm-down and supported on a table or other surface. Let your left / right wrist extend over the edge of the table. Hold a ________ weight or a rubber exercise band or tubing in your left / right hand. Using your uninjured hand, lift your left / right wrist up toward the ceiling. Release your left / right hand and begin to very slowly lower it toward the floor. This is eccentric extension. When you have gone as far as you can, use your uninjured hand to lift the left / right wrist up toward the ceiling again. Repeat __________ times. Complete this exercise __________ times a day. Forearm rotation, supination, and pronation     Sit with your left / right forearm supported on a table or other surface. Position your forearm so that the thumb is facing the ceiling (neutral position). Rest your left / right hand over the edge of the  table. Hold a hammer in your left / right hand. The exercise will be easier if you hold on near the head of the hammer. The exercise will be harder if you hold on toward the end of the handle. Without moving your elbow, slowly rotate your palm up toward the ceiling (supination). Slowly return to the starting position. Without moving your elbow, slowly rotate your palm down toward the floor (pronation). Slowly return to the starting position. Repeat __________ times. Complete this exercise __________ times a day. Biceps curls  Sit on a stable chair without armrests or stand up. Let your left / right arm rest at your side with your palm facing forward. Hold a __________ weight in your left / right hand, or grip a rubber exercise band or tubing with both hands. Bend your left / right elbow to bring your hand up toward your shoulder. Hold this position for __________ seconds. Slowly return your hand to your side. Repeat __________ times. Complete this exercise __________ times a day. Elbow extension  Lie on your back.  Hold a __________ weight in your left / right hand. Start by straightening your elbow so that your hand is overhead, pointing toward the ceiling. Use your other hand to support your left / right arm and keep it still. Bend your left / right elbow to a 90 degree angle (right angle). Slowly return to the starting position. Repeat __________ times. Complete this exercise __________ times a day. Shoulder external rotation You will need a rubber exercise band or tubing for this exercise. Place your exercise band into the hinge side of a door and close the door completely so it is securely closed. Stand next to the doorway so that your left / right side is away from the door. Bend your left / right elbow to a 90 degree angle (right angle), holding the exercise band in your hand. Step away from the doorway to reduce any slack in the exercise band. To make the exercise easier, step  closer to the doorway. To make the exercise harder, step farther away from the doorway. Keeping your elbow at your side and using a controlled movement, slowly move your left / right hand away from the door (external rotation). Hold this position for _________ seconds. Slowly return to the starting position. Repeat __________ times. Complete this exercise __________ times a day. Grip  Grasp a stress ball or other ball in the palm of your left / right hand. Start with your elbow bent to a 90 degree angle (right angle). Slowly increase the pressure, squeezing the ball as hard as you can without causing pain. Think of bringing the tips of your fingers into the middle of your palm. All of your finger joints should bend when doing this exercise. To make this exercise harder, slowly try to straighten your elbow in front of you, until you can do the exercise with your elbow fully straight. Hold your squeeze for ________ seconds, then relax. If told by your provider, do this exercise with: Your forearm positioned so that the thumb is facing the ceiling (neutral position). Your forearm turned palm down. Your forearm turned palm up. Repeat __________ times. Complete this exercise __________ times a day. This information is not intended to replace advice given to you by your health care provider. Make sure you discuss any questions you have with your health care provider. Document Revised: 11/13/2021 Document Reviewed: 11/13/2021 Elsevier Patient Education  2024 [employer] Sleep Apnea Sleep apnea affects breathing during sleep. It causes breathing to stop for 10 seconds or more, or to become shallow. People with sleep apnea usually snore loudly. It can also increase the risk of: Heart attack. Stroke. Being very overweight (obese). Diabetes. Heart failure. Irregular heartbeat. High blood pressure. The goal of treatment is to help you breathe normally again. What are the causes?  The most  common cause of this condition is a collapsed or blocked airway. There are three kinds of sleep apnea: Obstructive sleep apnea. This is caused by a blocked or collapsed airway. Central sleep apnea. This happens when the brain does not send the right signals to the muscles that control breathing. Mixed sleep apnea. This is a combination of obstructive and central sleep apnea. What increases the risk? Being overweight. Smoking. Having a small airway. Being older. Being male. Drinking alcohol. Taking medicines to calm yourself (sedatives or tranquilizers). Having family members with the condition. Having a tongue or tonsils that are larger than normal. What are the signs or symptoms? Trouble staying asleep. Loud snoring. Headaches in the morning.  Waking up gasping. Dry mouth or sore throat in the morning. Being sleepy or tired during the day. If you are sleepy or tired during the day, you may also: Not be able to focus your mind (concentrate). Forget things. Get angry a lot and have mood swings. Feel sad (depressed). Have changes in your personality. Have less interest in sex, if you are male. Be unable to have an erection, if you are male. How is this treated?  Sleeping on your side. Using a medicine to get rid of mucus in your nose (decongestant). Avoiding the use of alcohol, medicines to help you relax, or certain pain medicines (narcotics). Losing weight, if needed. Changing your diet. Quitting smoking. Using a machine to open your airway while you sleep, such as: An oral appliance. This is a mouthpiece that shifts your lower jaw forward. A CPAP device. This device blows air through a mask when you breathe out (exhale). An EPAP device. This has valves that you put in each nostril. A BIPAP device. This device blows air through a mask when you breathe in (inhale) and breathe out. Having surgery if other treatments do not work. Follow these instructions at  home: Lifestyle Make changes that your doctor recommends. Eat a healthy diet. Lose weight if needed. Avoid alcohol, medicines to help you relax, and some pain medicines. Do not smoke or use any products that contain nicotine  or tobacco. If you need help quitting, ask your doctor. General instructions Take over-the-counter and prescription medicines only as told by your doctor. If you were given a machine to use while you sleep, use it only as told by your doctor. If you are having surgery, make sure to tell your doctor you have sleep apnea. You may need to bring your device with you. Keep all follow-up visits. Contact a doctor if: The machine that you were given to use during sleep bothers you or does not seem to be working. You do not get better. You get worse. Get help right away if: Your chest hurts. You have trouble breathing in enough air. You have an uncomfortable feeling in your back, arms, or stomach. You have trouble talking. One side of your body feels weak. A part of your face is hanging down. These symptoms may be an emergency. Get help right away. Call your local emergency services (911 in the [place] Do not wait to see if the symptoms will go away. Do not drive yourself to the hospital. Summary This condition affects breathing during sleep. The most common cause is a collapsed or blocked airway. The goal of treatment is to help you breathe normally while you sleep. This information is not intended to replace advice given to you by your health care provider. Make sure you discuss any questions you have with your health care provider. Document Revised: 10/10/2020 Document Reviewed: 02/10/2020 Elsevier Patient Education  2024 [employer] Screening for Sleep Apnea  Sleep apnea is a condition in which breathing pauses or becomes shallow during sleep. Sleep apnea screening is a test to determine if you are at risk for sleep apnea. The test includes a series of questions.  It will only takes a few minutes. Your health care provider may ask you to have this test in preparation for surgery or as part of a physical exam. What are the symptoms of sleep apnea? Common symptoms of sleep apnea include: Snoring. Waking up often at night. Daytime sleepiness. Pauses in breathing. Choking or gasping during sleep. Irritability. Forgetfulness. Trouble thinking  clearly. Depression. Personality changes. Most people with sleep apnea do not know that they have it. What are the advantages of sleep apnea screening? Getting screened for sleep apnea can help: Ensure your safety. It is important for your health care providers to know whether or not you have sleep apnea, especially if you are having surgery or have other long-term (chronic) health conditions. Improve your health and allow you to get a better night's rest. Restful sleep can help you: Have more energy. Lose weight. Improve high blood pressure. Improve diabetes management. Prevent stroke. Prevent car accidents. What happens during the screening? Screening usually includes being asked a list of questions about your sleep quality. Some questions you may be asked include: Do you snore? Is your sleep restless? Do you have daytime sleepiness? Has a partner or spouse told you that you stop breathing during sleep? Have you had trouble concentrating or memory loss? What is your age? What is your neck circumference? To measure your neck, keep your back straight and gently wrap the tape measure around your neck. Put the tape measure at the middle of your neck, between your chin and collarbone. What is your sex assigned at birth? Do you have or are you being treated for high blood pressure? If your screening test is positive, you are at risk for the condition. Further testing may be needed to confirm a diagnosis of sleep apnea. Where to find more information You can find screening tools online or at your health care  clinic. For more information about sleep apnea screening and healthy sleep, visit these websites: Centers for Disease Control and Prevention: footballexhibition.com.br American Sleep Apnea Association: www.sleepapnea.org Contact a health care provider if: You think that you may have sleep apnea. Summary Sleep apnea screening can help determine if you are at risk for sleep apnea. It is important for your health care providers to know whether or not you have sleep apnea, especially if you are having surgery or have other chronic health conditions. You may be asked to take a screening test for sleep apnea in preparation for surgery or as part of a physical exam. This information is not intended to replace advice given to you by your health care provider. Make sure you discuss any questions you have with your health care provider. Document Revised: 02/10/2020 Document Reviewed: 02/10/2020 Elsevier Patient Education  2024 [employer].

## 2023-03-30 NOTE — Progress Notes (Addendum)
 Provider:  Dedra Gores, MD  Primary Care Physician:  Bruce Speaks, FNP [address] [place] [address]     Referring Provider: Petrina Pries, Np [address] [place],  [place] [address]          Chief Complaint according to patient   Patient presents with:     New problem : tingling in both hands.  Also needs a new CPAP but didn't bring it here.            HISTORY OF PRESENT ILLNESS:  Bruce Brooks. is a 48 y.o. male OSA patient who is here for revisit 03/30/2023 . He has been diagnosed in 2019, received a CPAP machine and is now due for a new machine. He didn't bring his CPAP here with him and no compliance data were obtained in today's visit.    Chief concern according to patient :  NEEDING a new CPAP   Only after I spoke to my staff, did I learn that this patint whom I have not followed directly for OSA is here for bilateral upper extremity tingling.   The referral note is short the patient presented for routine annual checkup on 12-17-2022 was seen by nurse practitioner ON him, pain in lower back achy and throbbing, back has been hurting.  Tingling in his left thigh and both arms and hands.  He has been successful with nicotine  patches had been on Chantix  which have been discontinued by now, he reduced his smoking he still smoking 5-8 a day.  The tingling in the extremities began in June 2024, he is woken up by a tingling in the left thigh he indicated to the front and medial part of the thigh which is the cutaneous branch of the femoral nerve.  Very commonly affected and obese patients but also in diabetics. He states that it is strictly positional dependent if he lays on his left side his left arm from the elbow on may tingle radiate into the in the ring finger middle finger small finger.  This can affect both sides depending on his sleep position.  It also resolves when he changes position. A CBC and a comprehensive metabolic  panel were ordered by primary care he has a history of hyperlipidemia he has an abnormal hemoglobin A1c in the past.  His body mass index is just above 42 today- , he continues with the nicotine  patches.   This may also contribute to the tingling if they affect blood flow.  He does not report cold hands or swollen hands.      5/23/2019[name], 48 year old male returns for follow-up with newly diagnosed obstructive sleep apnea here for initial CPAP compliance.  He claims he is not having any trouble with his machine he has had some allergy issues and due to the stuffiness has not always used it at night.  Compliance data dated 05/07/2017-08/04/2017 shows compliance greater than 4 hours for 26 days out of 90 at 29%.  Average usage 4 hours 10 minutes.  Set pressure 5 to 15 years.  EPR level 3 AHI 5.7.  ESS 7.  He returns for reevaluation 10/15/18CDFranklin C Brooks is a 48 y.o. male , seen here as in a referral  from Dr. Jarold and NP Brooks Bruce for a sleep apnea evaluation, after being told he snores and stops to breath at night in his sleep. Chief complaint according to patient : hypersomnia/ fatigue.  Bruce Brooks is a 48 year old African-American right-handed male patient, presenting with a feeling of increased sleepiness and fatigue in daytime, which  has slowly evolved over several years. He has changed his lifestyle in several ways, has adjusted his diet and has implemented a more active lifestyle. He is in the process of reducing his tobacco use and endorsed now 6 cigarettes a day on average. He very rarely drinks alcohol, he drinks coffee during daytime mostly in the morning about 16 ounces daily.   Sleep habits are as follows: He goes to bed between 10-11 PM, after a late dinner at 9.30 PM. The patient usually sleeps on his side, or falls asleep on her side with 2 pillows for head support. He shares the bedroom with a male partner. He describes his bedroom as cool, quiet and dark. He  has been told that he snores but he has never woken himself up from snoring. He has 2-3 time nocturia( 2.00- 4.00- finally at 6.30 AM).  Rises between 6.30 and 7 AM. He uses an alarm for backup, but is usually spontaneously awake at the time he needs to rise. He does not feel as refreshed and restored as he used to. He denies having a dry mouth, palpitations, nausea, dizziness or headaches/ pain. He endorsed joint stiffness. He estimates ( through fit bit ) 4-6 hours of sleep each night      Review of Systems: Out of a complete 14 system review, the patient complains of only the following symptoms, and all other reviewed systems are negative.:  Fatigue, sleepiness , snoring, fragmented sleep, Insomnia, RLS, Nocturia    How likely are you to doze in the following situations: 0 = not likely, 1 = slight chance, 2 = moderate chance, 3 = high chance   Sitting and Reading? Watching Television? Sitting inactive in a public place (theater or meeting)? As a passenger in a car for an hour without a break? Lying down in the afternoon when circumstances permit? Sitting and talking to someone? Sitting quietly after lunch without alcohol? In a car, while stopped for a few minutes in traffic?   Total = 06/ 24 points   FSS endorsed at Y/ 63 points.   Social History   Socioeconomic History   Marital status: Single    Spouse name: Not on file   Number of children: Not on file   Years of education: Not on file   Highest education level: Not on file  Occupational History   Not on file  Tobacco Use   Smoking status: Every Day    Current packs/day: 0.50    Average packs/day: 0.5 packs/day for 22.0 years (11.0 ttl pk-yrs)    Types: Cigarettes   Smokeless tobacco: Never   Tobacco comments:    smokes 7-8 cigarettes - just started chantix  - he had a smoking cessation course. Now smoking 1/2 PPD. No longer taking chantix . Smoking approx 8 cig - 9/22  Substance and Sexual Activity   Alcohol use: Yes     Alcohol/week: 1.0 standard drink of alcohol    Types: 1 Cans of beer per week    Comment: occasional   Drug use: No   Sexual activity: Not on file  Other Topics Concern   Not on file  Social History Narrative   Not on file   Social Drivers of Health   Financial Resource Strain: Not on file  Food Insecurity: Not on file  Transportation Needs: Not on file  Physical Activity: Not on file  Stress: Not on file  Social Connections: Not on file    Family History  Problem Relation Age of Onset   Breast cancer Mother 21   Colon polyps Father 3   Colon polyps Maternal Uncle 60   Colon cancer Maternal Uncle 60   Esophageal cancer Neg Hx    Rectal cancer Neg Hx    Stomach cancer Neg Hx     Past Medical History:  Diagnosis Date   GERD (gastroesophageal reflux disease)    with certain foods   Hyperlipidemia    OSA (obstructive sleep apnea)    Seasonal allergies    Sleep apnea    uses CPAP   Tobacco dependence    Vitamin D  deficiency     History reviewed. No pertinent surgical history.   Current Outpatient Medications on File Prior to Visit  Medication Sig Dispense Refill   nicotine  (NICODERM CQ  - DOSED IN MG/24 HOURS) 21 mg/24hr patch Place 1 patch (21 mg total) onto the skin daily. 30 patch 1   No current facility-administered medications on file prior to visit.    No Known Allergies   DIAGNOSTIC DATA (LABS, IMAGING, TESTING) - I reviewed patient records, labs, notes, testing and imaging myself where available.  Lab Results  Component Value Date   WBC 6.7 12/17/2022   HGB 14.4 12/17/2022   HCT 43.9 12/17/2022   MCV 90 12/17/2022   PLT 224 12/17/2022      Component Value Date/Time   NA 141 12/17/2022 1244   K 4.5 12/17/2022 1244   CL 106 12/17/2022 1244   CO2 21 12/17/2022 1244   GLUCOSE 66 (L) 12/17/2022 1244   BUN 11 12/17/2022 1244   CREATININE 1.08 12/17/2022 1244   CALCIUM 9.1 12/17/2022 1244   PROT 7.0 12/17/2022 1244   ALBUMIN 4.3 12/17/2022  1244   AST 17 12/17/2022 1244   ALT 23 12/17/2022 1244   ALKPHOS 59 12/17/2022 1244   BILITOT <0.2 12/17/2022 1244   GFRNONAA 77 12/01/2019 1212   GFRAA 89 12/01/2019 1212   Lab Results  Component Value Date   CHOL 194 12/17/2022   HDL 43 12/17/2022   LDLCALC 136 (H) 12/17/2022   TRIG 83 12/17/2022   CHOLHDL 4.5 12/17/2022   Lab Results  Component Value Date   HGBA1C 5.8 (H) 12/17/2022   No results found for: VITAMINB12 Lab Results  Component Value Date   TSH 1.910 11/25/2018    PHYSICAL EXAM:  Today's Vitals   03/30/23 0957  BP: 135/75  Pulse: 65  Weight: 269 lb 8 oz (122.2 kg)  Height: 5' 7 (1.702 m)   Body mass index is 42.21 kg/m.   Wt Readings from Last 3 Encounters:  03/30/23 269 lb 8 oz (122.2 kg)  01/28/23 262 lb (118.8 kg)  12/17/22 261 lb (118.4 kg)     Ht Readings from Last 3 Encounters:  03/30/23 5' 7 (1.702 m)  01/28/23 5' 8 (1.727 m)  12/17/22 5' 8 (1.727 m)      General: The patient is awake, alert and appears not in acute distress. The patient is well groomed. Head: Normocephalic, atraumatic. Neck is supple.  Mallampati 4 ( 3 plus) ,  neck circumference:21 inches . Nasal airflow fully patent.  Retrognathia is not seen.  Dental status: biological  Cardiovascular:  Regular rate and cardiac rhythm by pulse,  without distended neck veins. Respiratory: Lungs are clear to auscultation.  Skin:  Without evidence of ankle edema, or rash. Trunk: The patient's posture  is erect.   NEUROLOGIC EXAM: The patient is awake and alert, oriented to place and time.   Memory subjective described as intact.  Attention span & concentration ability appears normal.  Speech is fluent,  without  dysarthria, dysphonia or aphasia.  Mood and affect are appropriate.   Cranial nerves: no loss of smell or taste reported  Pupils are equal and briskly reactive to light. Funduscopic exam deferred.  Extraocular movements in vertical and horizontal planes were  intact and without nystagmus. No Diplopia. Visual fields by finger perimetry are intact. Hearing was intact to soft voice and finger rubbing.    Facial sensation intact to fine touch.  Facial motor strength was symmetric and tongue was midline.  Neck ROM : rotation, tilt and flexion extension were normal for age and shoulder shrug was symmetrical.    Motor exam:  Symmetric bulk, tone and ROM.   Normal tone without cog -wheeling, symmetric grip strength .   Sensory:  Fine touch and vibration were tested :  no tingling is present now.  Proprioception tested in the upper extremities was normal.   Coordination: Rapid alternating movements in the fingers/hands were of normal speed.  The Finger-to-nose maneuver was intact without evidence of ataxia, dysmetria or tremor.   Gait and station: Patient could rise unassisted from a seated position, walked without assistive device.  Stance is of normal width/ base .  Toe and heel walk were deferred.  Deep tendon reflexes: in the  upper and lower extremities are symmetric and intact.  Babinski response was deferred .    ASSESSMENT AND PLAN 48 y.o. male  here with:    1) tingling and numbness left thigh- sensory ( cutaneous branch ) of the femoral nerve. Resolves within minutes of waking him up and taking a different position.  2) Elbow zinging and ulnar nerve tingling in hands- positional dependent ,  resolves when position is changed.  Never on both sides at the same time, always after leaning or sleeping on the affected side, resolving quickly.  The hands are not cold, not weak,  no neck or shoulder pain with that- and not sustained abnormal sensation- there is low yield from NCS and EMG for this symptoms.   3) he is a longstanding OSA patient on CPAP and due for a new machine,  I was able to get a download of data through my nurse set up for the current machine was June 09, 2017.  So this this machine will be 48 years old.  It is indeed to be  changed he has been 100% compliant use of CPAP by days and by hours.  The average of 7 hours and 25 minutes at night.  This is an AutoSet AirSense 10 by ResMed set between 5 and 15 cm water with 3 cm EPR pressure at the 95th percentile is 14.9 cm so it is straddles the maximum.  His AHI is only 1.1/h very good resolution and all residual apneas are obstructive.  There is very little air leak so the current mask does not have to be changed the 95th percentile air leak would be 3.9 L/min.  No Cheyne-Stokes respirations were observed.  I will order a home sleep test just to get a baseline so I will ask Bruce Brooks 1 night to sleep with the home sleep test and not being on CPAP for that 1 night.  The new machine should exceed the 95th percentile pressure by 3 cm water so he would be set  between 6 and 18 cm water.   HST ordered.  Compression of nerves at groin and elbow are unlikely to show up on a NCV and EMG test.  He should consider magnesium  OTC at night. 200 mg. I will ask him to do exercises for  pitcher's elbow.    I plan to follow up either personally or through our NP within 3-5 months.   I would like to thank Bruce Speaks, FNP and Bruce Pries, Np [address] [place],  [place] [address] for allowing me to meet with and to take care of this pleasant patient.     After spending a total time of  40  minutes face to face and additional time for physical and neurologic examination, review of laboratory studies,  personal review of imaging studies, reports and results of other testing and review of referral information / records as far as provided in visit,   Electronically signed by: Bruce Gores, MD 03/30/2023 10:04 AM  [facility] Board certified by The [employer] of Sleep Medicine and Diplomate of the [employer] of Sleep Medicine. Board certified In Neurology through the ABPN, Fellow of the [employer] of  Neurology.

## 2023-03-30 NOTE — Addendum Note (Signed)
 Addended by: Melvyn Novas on: 03/30/2023 10:42 AM   Modules accepted: Orders

## 2023-04-16 ENCOUNTER — Ambulatory Visit: Payer: 59 | Admitting: Neurology

## 2023-04-16 DIAGNOSIS — R208 Other disturbances of skin sensation: Secondary | ICD-10-CM

## 2023-04-16 DIAGNOSIS — E66813 Obesity, class 3: Secondary | ICD-10-CM

## 2023-04-16 DIAGNOSIS — G4733 Obstructive sleep apnea (adult) (pediatric): Secondary | ICD-10-CM

## 2023-04-16 DIAGNOSIS — E661 Drug-induced obesity: Secondary | ICD-10-CM

## 2023-04-16 DIAGNOSIS — G5623 Lesion of ulnar nerve, bilateral upper limbs: Secondary | ICD-10-CM

## 2023-04-16 DIAGNOSIS — F17210 Nicotine dependence, cigarettes, uncomplicated: Secondary | ICD-10-CM

## 2023-05-13 ENCOUNTER — Ambulatory Visit: Payer: BC Managed Care – PPO | Admitting: Neurology

## 2023-05-22 NOTE — Progress Notes (Signed)
 [facility]  [name]. 48 year old male 05/05/1975   HOME SLEEP TEST REPORT ( by [facility] mail out Kit- )     STUDY DATE:  04-19-2023, but downloaded on March 09-2023   ORDERING CLINICIAN:  Melvyn Novas, MD  REFERRING CLINICIAN:  Margie Ege, NP    CLINICAL INFORMATION/HISTORY:  active CPAP user, with excellent compliance, needing a new CPAP. Bruce Brooks. is a 48 y.o. male OSA patient who is here for revisit 03/30/2023 . He has been diagnosed in 2019, received a CPAP machine and is now due for a new machine. He didn't bring his CPAP here with him and no compliance data were obtained in today's visit.   5/23/2019CM [name], 48 year old male returns for follow-up with newly diagnosed obstructive sleep apnea here for initial CPAP compliance.  He claims he is not having any trouble with his machine he has had some allergy issues and due to the stuffiness has not always used it at night.  Compliance data dated 05/07/2017-08/04/2017 shows compliance greater than 4 hours for 26 days out of 90 at 29%.  Average usage 4 hours 10 minutes.  Set pressure 5 to 15 years.  EPR level 3 AHI 5.7.  ESS 7.    Epworth sleepiness score:  06/ 24 points   FSS endorsed at XX/ 63 points.    BMI:  42.2 kg/m   Neck Circumference: 21"    FINDINGS:   Sleep Summary:   Total Recording Time (hours, min):    The study began on 19 April 2023 at 10:55 PM and recorded until 7 AM.  Total sleep time was 6 hours 21 minutes, sleep efficiency 78%                                              Respiratory Indices:   Calculated pAHI (per hour):     The general apnea-hypopnea index was 9.8/h which would constitute a mild all obstructive sleep apnea.                                              Supine AHI: The patient did not change positions throughout this sleep study.                                                 Oxygen Saturation Statistics:   Oxygen Saturation  (%) Mean: Mean oxygen saturation was 91.7%, the minimum oxygen saturation at nadir was 77%, the maximum saturation 100%.                                         O2 Saturation (minutes) <89%:     3 minutes      Pulse Rate Statistics:   Pulse Mean (bpm):     67 bpm            Pulse Range:      Between a minimum heart rate of 57  bpm and a maximum of 87 bpm during sleep.         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  I would consider this apnea mild but would like for the patient to continue with positive airway pressure therapy as he has benefited from in the past.  I will order a new machine to the similar settings of his last positive airway pressure device.   RECOMMENDATION: 6 through 16 cm water pressure,  2 cm EPR , heated humidity and mask of choice.     INTERPRETING PHYSICIAN:   Melvyn Novas, MD   [facility] [address], [address] [place], [place] [address] [phone]

## 2023-06-02 ENCOUNTER — Encounter: Payer: Self-pay | Admitting: Neurology

## 2023-06-02 NOTE — Procedures (Signed)
 [facility]  Bruce Brooks. 48 year old male 04/09/1975   HOME SLEEP TEST REPORT ( by [facility] mail out Kit- )     STUDY DATE:  04-19-2023, but downloaded on March 09-2023   ORDERING CLINICIAN:  Melvyn Novas, MD  REFERRING CLINICIAN:  Margie Ege, NP    CLINICAL INFORMATION/HISTORY:  active CPAP user, with excellent compliance, needing a new CPAP. Bruce Hass. is a 48 y.o. male OSA patient who is here for revisit 03/30/2023 . He has been diagnosed in 2019, received a CPAP machine and is now due for a new machine. He didn't bring his CPAP here with him and no compliance data were obtained in today's visit.   5/23/2019[name], 48 year old male returns for follow-up with newly diagnosed obstructive sleep apnea here for initial CPAP compliance.  He claims he is not having any trouble with his machine he has had some allergy issues and due to the stuffiness has not always used it at night.  Compliance data dated 05/07/2017-08/04/2017 shows compliance greater than 4 hours for 26 days out of 90 at 29%.  Average usage 4 hours 10 minutes.  Set pressure 5 to 15 years.  EPR level 3 AHI 5.7.  ESS 7.    Epworth sleepiness score:  06/ 24 points   FSS endorsed at XX/ 63 points.    BMI:  42.2 kg/m   Neck Circumference: 21"    FINDINGS:   Sleep Summary:   Total Recording Time (hours, min):    The study began on 19 April 2023 at 10:55 PM and recorded until 7 AM.  Total sleep time was 6 hours 21 minutes, sleep efficiency 78%                                              Respiratory Indices:   Calculated pAHI (per hour):     The general apnea-hypopnea index was 9.8/h which would constitute a mild all obstructive sleep apnea.                                              Supine AHI: The patient did not change positions throughout this sleep study.                                                 Oxygen Saturation Statistics:   Oxygen Saturation (%) Mean:  Mean oxygen saturation was 91.7%, the minimum oxygen saturation at nadir was 77%, the maximum saturation 100%.                                         O2 Saturation (minutes) <89%:     3 minutes      Pulse Rate Statistics:   Pulse Mean (bpm):     67 bpm            Pulse Range:      Between a minimum heart rate of 57 bpm and a maximum  of 87 bpm during sleep.         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  I would consider this apnea mild but would like for the patient to continue with positive airway pressure therapy as he has benefited from in the past.  I will order a new machine to the similar settings of his last positive airway pressure device.   RECOMMENDATION: Auto titration ResMed CPAP device - 6 cm  through 16 cm water pressure,  2 cm EPR , heated humidity and mask of choice.     INTERPRETING PHYSICIAN:   Melvyn Novas, MD   [facility] [address], [address] [place], [place] [address] [phone]

## 2023-06-02 NOTE — Progress Notes (Signed)
 Mild OSA confirmed ( milder than initially thought ) new machine is ordered : Auto titration ResMed CPAP device - 6 cm  through 16 cm water pressure,  2 cm EPR , heated humidity and mask of choice.

## 2023-07-27 NOTE — Progress Notes (Unsigned)
 Patient: Bruce Brooks. Date of Birth: [birth year]  Reason for Visit: Follow up History from: Patient Primary Neurologist: Dohmeier   ASSESSMENT AND PLAN 48 y.o. year old male   1.  OSA on CPAP  -Order new CPAP, his was set up in March 2019.  -HST 04/19/23, mild OSA 9.8/h (he wore his CPAP) -Current settings are 5-15 cm water, will write for 5-16 cm water, EPR 2 (per Dr. Albertina Hugger order in Jan, currently at 3) - Follow-up 30 to 90 days after starting new CPAP machine -Will schedule 1 year revisit for CPAP  HISTORY OF PRESENT ILLNESS: Today 07/28/23 Update 07/28/23 SS: Saw Dr. Albertina Hugger Jan 2025, HST for new CPAP machine 04/19/23, mild OSA 9.8/h (he wore his CPAP). Was reporting some sensory symptoms tingling to hands, thigh, advised magnesium, home exercises. He never got a new CPAP machine, his was issued in March 2019. Uses nasal pillow mask. 100% compliance, 5-15 cm water, EPR 3, AHI 1.6, leak 3.6. His current CPAP is falling apart. Continues with excellent CPAP benefit, without it, he feels terrible. ESS 10.  Bruce Brooks. is a 48 y.o. male OSA patient who is here for revisit 03/30/2023 . He has been diagnosed in 2019, received a CPAP machine and is now due for a new machine. He didn't bring his CPAP here with him and no compliance data were obtained in today's visit.    Chief concern according to patient :  "NEEDING a new CPAP "   Only after I spoke to my staff, did I learn that this patint whom I have not followed directly for OSA is here for bilateral upper extremity tingling.    The referral note is short the patient presented for routine annual checkup on 12-17-2022 was seen by nurse practitioner ON him, pain in lower back achy and throbbing, back has been hurting.  Tingling in his left thigh and both arms and hands.  He has been successful with nicotine  patches had been on Chantix  which have been discontinued by now, he reduced his smoking he still smoking 5-8 a day.   The tingling in the extremities began in June 2024, he is woken up by a tingling in the left thigh he indicated to the front and medial part of the thigh which is the cutaneous branch of the femoral nerve.  Very commonly affected and obese patients but also in diabetics. He states that it is strictly positional dependent if he lays on his left side his left arm from the elbow on may tingle radiate into the in the ring finger middle finger small finger.  This can affect both sides depending on his sleep position.  It also resolves when he changes position. A CBC and a comprehensive metabolic panel were ordered by primary care he has a history of hyperlipidemia he has an abnormal hemoglobin A1c in the past.  His body mass index is just above 42 today- , he continues with the nicotine  patches.   This may also contribute to the tingling if they affect blood flow.  He does not report cold hands or swollen hands.  HISTORY 05/07/22 SS: Here today via VV, using CPAP excellent. Uses nasal pillow mask.  He started using his CPAP machine in 2018.  Review of CPAP download 04/06/22-05/05/22 shows 30/30 days usage greater than 4 hours 100%.  Average usage 6 hours 35 minutes.  Minimum pressure 7, maximum 15 cm water.  EPR level 3.  Leak 1.0, AHI 1.7.  Pressure 95th percentile 14.7, maximum 14.9.  Is very compliant with CPAP use. Feels like getting enough air.    REVIEW OF SYSTEMS: Out of a complete 14 system review of symptoms, the patient complains only of the following symptoms, and all other reviewed systems are negative.  See HPI  ALLERGIES: No Known Allergies  HOME MEDICATIONS: Outpatient Medications Prior to Visit  Medication Sig Dispense Refill   nicotine  (NICODERM CQ  - DOSED IN MG/24 HOURS) 21 mg/24hr patch Place 1 patch (21 mg total) onto the skin daily. 30 patch 1   No facility-administered medications prior to visit.    PAST MEDICAL HISTORY: Past Medical History:  Diagnosis Date   GERD  (gastroesophageal reflux disease)    with certain foods   Hyperlipidemia    OSA (obstructive sleep apnea)    Seasonal allergies    Sleep apnea    uses CPAP   Tobacco dependence    Vitamin D  deficiency     PAST SURGICAL HISTORY: No past surgical history on file.  FAMILY HISTORY: Family History  Problem Relation Age of Onset   Breast cancer Mother 5   Colon polyps Father 32   Colon polyps Maternal Uncle 34   Colon cancer Maternal Uncle 60   Esophageal cancer Neg Hx    Rectal cancer Neg Hx    Stomach cancer Neg Hx     SOCIAL HISTORY: Social History   Socioeconomic History   Marital status: Single    Spouse name: Not on file   Number of children: Not on file   Years of education: Not on file   Highest education level: Not on file  Occupational History   Not on file  Tobacco Use   Smoking status: Every Day    Current packs/day: 0.50    Average packs/day: 0.5 packs/day for 22.0 years (11.0 ttl pk-yrs)    Types: Cigarettes   Smokeless tobacco: Never   Tobacco comments:    smokes 7-8 cigarettes - just started chantix  - he had a smoking cessation course. Now smoking 1/2 PPD. No longer taking chantix . Smoking approx 8 cig - 9/22  Substance and Sexual Activity   Alcohol use: Yes    Alcohol/week: 1.0 standard drink of alcohol    Types: 1 Cans of beer per week    Comment: occasional   Drug use: No   Sexual activity: Not on file  Other Topics Concern   Not on file  Social History Narrative   Not on file   Social Drivers of Health   Financial Resource Strain: Not on file  Food Insecurity: Not on file  Transportation Needs: Not on file  Physical Activity: Not on file  Stress: Not on file  Social Connections: Not on file  Intimate Partner Violence: Not on file    PHYSICAL EXAM  Vitals:   07/28/23 1248  BP: 129/82  Pulse: 67  Weight: 273 lb 9.6 oz (124.1 kg)  Height: 5\' 7"  (1.702 m)   Body mass index is 42.85 kg/m.  Generalized: Well developed, in no  acute distress  Neurological examination  Mentation: Alert oriented to time, place, history taking. Follows all commands speech and language fluent Cranial nerve II-XII: Pupils were equal round reactive to light. Extraocular movements were full, visual field were full on confrontational test. Facial sensation and strength were normal.  Head turning and shoulder shrug  were normal and symmetric. Motor: The motor testing reveals 5 over 5 strength of all 4 extremities. Good symmetric motor tone is  noted throughout.  Sensory: Sensory testing is intact to soft touch on all 4 extremities. No evidence of extinction is noted.  Coordination: Cerebellar testing reveals good finger-nose-finger and heel-to-shin bilaterally.  Gait and station: Gait is normal   DIAGNOSTIC DATA (LABS, IMAGING, TESTING) - I reviewed patient records, labs, notes, testing and imaging myself where available.  Lab Results  Component Value Date   WBC 6.7 12/17/2022   HGB 14.4 12/17/2022   HCT 43.9 12/17/2022   MCV 90 12/17/2022   PLT 224 12/17/2022      Component Value Date/Time   NA 141 12/17/2022 1244   K 4.5 12/17/2022 1244   CL 106 12/17/2022 1244   CO2 21 12/17/2022 1244   GLUCOSE 66 (L) 12/17/2022 1244   BUN 11 12/17/2022 1244   CREATININE 1.08 12/17/2022 1244   CALCIUM 9.1 12/17/2022 1244   PROT 7.0 12/17/2022 1244   ALBUMIN 4.3 12/17/2022 1244   AST 17 12/17/2022 1244   ALT 23 12/17/2022 1244   ALKPHOS 59 12/17/2022 1244   BILITOT <0.2 12/17/2022 1244   GFRNONAA 77 12/01/2019 1212   GFRAA 89 12/01/2019 1212   Lab Results  Component Value Date   CHOL 194 12/17/2022   HDL 43 12/17/2022   LDLCALC 136 (H) 12/17/2022   TRIG 83 12/17/2022   CHOLHDL 4.5 12/17/2022   Lab Results  Component Value Date   HGBA1C 5.8 (H) 12/17/2022   No results found for: "VITAMINB12" Lab Results  Component Value Date   TSH 1.910 11/25/2018   Jeanmarie Millet, AGNP-C, DNP 07/28/2023, 12:55 PM [facility] [address] [place], [place] [address] [phone]

## 2023-07-28 ENCOUNTER — Ambulatory Visit: Payer: 59 | Admitting: Neurology

## 2023-07-28 ENCOUNTER — Telehealth: Payer: Self-pay | Admitting: Neurology

## 2023-07-28 ENCOUNTER — Telehealth: Payer: Self-pay

## 2023-07-28 VITALS — BP 129/82 | HR 67 | Ht 67.0 in | Wt 273.6 lb

## 2023-07-28 DIAGNOSIS — G4733 Obstructive sleep apnea (adult) (pediatric): Secondary | ICD-10-CM

## 2023-07-28 NOTE — Telephone Encounter (Signed)
-----   Message from Bruce Brooks sent at 07/28/2023  1:18 PM EDT ----- Please send order to DME for new CPAP machine

## 2023-07-28 NOTE — Telephone Encounter (Signed)
 Pt called to notify turning on [address] will be there in 3 minutes. Informed have to be here before 122:45 pm.

## 2023-07-28 NOTE — Patient Instructions (Signed)
 Great to see you today.  I will order a new CPAP machine.  We will see each other in 30 to 90 days after starting CPAP.  Continue nightly use minimum 4 hours.  Let me know if you have any questions.  Thanks!!

## 2023-07-28 NOTE — Telephone Encounter (Signed)
 Community msg sent to dme DME [facility]  [phone]

## 2023-07-29 NOTE — Telephone Encounter (Signed)
 Confirmation received.

## 2023-12-22 ENCOUNTER — Encounter: Payer: Self-pay | Admitting: Nurse Practitioner

## 2023-12-22 ENCOUNTER — Ambulatory Visit: Payer: BC Managed Care – PPO | Admitting: Nurse Practitioner

## 2023-12-22 VITALS — BP 110/80 | HR 60 | Temp 98.9°F | Ht 67.0 in | Wt 268.6 lb

## 2023-12-22 DIAGNOSIS — E559 Vitamin D deficiency, unspecified: Secondary | ICD-10-CM

## 2023-12-22 DIAGNOSIS — Z2821 Immunization not carried out because of patient refusal: Secondary | ICD-10-CM

## 2023-12-22 DIAGNOSIS — E66813 Obesity, class 3: Secondary | ICD-10-CM | POA: Diagnosis not present

## 2023-12-22 DIAGNOSIS — G4733 Obstructive sleep apnea (adult) (pediatric): Secondary | ICD-10-CM

## 2023-12-22 DIAGNOSIS — Z125 Encounter for screening for malignant neoplasm of prostate: Secondary | ICD-10-CM

## 2023-12-22 DIAGNOSIS — Z0000 Encounter for general adult medical examination without abnormal findings: Secondary | ICD-10-CM

## 2023-12-22 DIAGNOSIS — M79674 Pain in right toe(s): Secondary | ICD-10-CM | POA: Diagnosis not present

## 2023-12-22 DIAGNOSIS — Z139 Encounter for screening, unspecified: Secondary | ICD-10-CM

## 2023-12-22 DIAGNOSIS — Z79899 Other long term (current) drug therapy: Secondary | ICD-10-CM

## 2023-12-22 DIAGNOSIS — Z6841 Body Mass Index (BMI) 40.0 and over, adult: Secondary | ICD-10-CM

## 2023-12-22 DIAGNOSIS — R7309 Other abnormal glucose: Secondary | ICD-10-CM

## 2023-12-22 DIAGNOSIS — F17200 Nicotine dependence, unspecified, uncomplicated: Secondary | ICD-10-CM

## 2023-12-22 DIAGNOSIS — R21 Rash and other nonspecific skin eruption: Secondary | ICD-10-CM

## 2023-12-22 DIAGNOSIS — E661 Drug-induced obesity: Secondary | ICD-10-CM

## 2023-12-22 DIAGNOSIS — Z13220 Encounter for screening for lipoid disorders: Secondary | ICD-10-CM

## 2023-12-22 DIAGNOSIS — E7849 Other hyperlipidemia: Secondary | ICD-10-CM

## 2023-12-22 DIAGNOSIS — Z136 Encounter for screening for cardiovascular disorders: Secondary | ICD-10-CM

## 2023-12-22 NOTE — Progress Notes (Signed)
 LILLETTE Kristeen JINNY Gladis, CMA,acting as a Neurosurgeon for Bruce Ada, FNP.,have documented all relevant documentation on the behalf of Bruce Ada, FNP,as directed by  Bruce Ada, FNP while in the presence of Bruce Ada, FNP.  Subjective:   Patient ID: Bruce Brooks. , male    DOB: [birth year] , 48 y.o.   MRN: [id number]  Chief Complaint  Patient presents with   Annual Exam    Patient presents today for HM, Patient reports compliance with medication. Patient denies any chest pain, SOB, or headaches.    Abdominal Pain    Patient reports he has a pain under his stomach on the left side, patient reports he first felt it about 2 months he reports he does think it is getting better but if he coughs he feels pain.      HPI  Discussed the use of AI scribe software for clinical note transcription with the patient, who gave verbal consent to proceed.  History of Present Illness [name]. is a 48 year old male who presents for an annual physical exam.  He had a colonoscopy about a year ago and recently visited an ear, nose, and throat doctor for fluid in his ears, which were itching and irritated. He was treated with ear cleaning and ear drops.  He continues to use a CPAP machine every night for sleep apnea.  He mentions a decrease in his exercise routine since the beginning of September, previously engaging in about 30 minutes of exercise daily, totaling approximately two hours a week. He notes a toe injury from playing tennis in early September, resulting in bruising and a hematoma on his right big toe. He reports mild pain in the joint when pressed.  He describes intermittent stomach pain lasting about a month, which has improved significantly. The pain was located under his stomach and was exacerbated by unexpected coughing.  He smokes about half a pack of cigarettes daily and has attempted to quit using nicotine  patches, which he finds helpful but struggles with  consistency.  He mentions stiffness and pain in his Achilles tendon upon waking, which improves throughout the day.  He notes a rash behind his right ear, which he has not treated with any creams or ointments.  No shortness of breath, chest pain, difficulty swallowing, or swelling in his feet or ankles.   Past Medical History:  Diagnosis Date   GERD (gastroesophageal reflux disease)    with certain foods   Hyperlipidemia    OSA (obstructive sleep apnea)    Seasonal allergies    Sleep apnea    uses CPAP   Tobacco dependence    Vitamin D  deficiency      Family History  Problem Relation Age of Onset   Breast cancer Mother 38   Colon polyps Father 39   Colon polyps Maternal Uncle 60   Colon cancer Maternal Uncle 60   Esophageal cancer Neg Hx    Rectal cancer Neg Hx    Stomach cancer Neg Hx      Current Outpatient Medications:    nicotine  (NICODERM CQ  - DOSED IN MG/24 HOURS) 21 mg/24hr patch, Place 1 patch (21 mg total) onto the skin daily., Disp: 30 patch, Rfl: 1   No Known Allergies   Men's preventive visit. Patient Health Questionnaire (PHQ-2) is  Flowsheet Row Office Visit from 12/22/2023 in [facility]  PHQ-2 Total Score 0  Patient is on a Regular diet. Exercising was more regular during  the summer. Marital status: Single. Relevant history for alcohol use is:  Social History   Substance and Sexual Activity  Alcohol Use Yes   Alcohol/week: 1.0 standard drink of alcohol   Types: 1 Cans of beer per week   Comment: occasional   Relevant history for tobacco use is:  Social History   Tobacco Use  Smoking Status Every Day   Current packs/day: 0.50   Average packs/day: 0.5 packs/day for 22.0 years (11.0 ttl pk-yrs)   Types: Cigarettes  Smokeless Tobacco Never  Tobacco Comments   smokes 7-8 cigarettes - just started chantix  - he had a smoking cessation course. Now smoking 1/2 PPD. No longer taking chantix . Smoking approx 8 cig -  9/22, 10/7 - 1/2 ppd  .   Review of Systems  Constitutional: Negative.   HENT: Negative.    Eyes: Negative.   Respiratory:  Positive for cough (occasionally will have a morning cough).   Cardiovascular: Negative.   Gastrointestinal: Negative.   Psychiatric/Behavioral: Negative.       Today's Vitals   12/22/23 0845  BP: 110/80  Pulse: 60  Temp: 98.9 F (37.2 C)  TempSrc: Oral  Weight: 268 lb 9.6 oz (121.8 kg)  Height: 5' 7 (1.702 m)  PainSc: 3   PainLoc: Ankle   Body mass index is 42.07 kg/m.  Wt Readings from Last 3 Encounters:  12/22/23 268 lb 9.6 oz (121.8 kg)  07/28/23 273 lb 9.6 oz (124.1 kg)  03/30/23 269 lb 8 oz (122.2 kg)   The 10-year ASCVD risk score (Arnett DK, et al., 2019) is: 6.2%   Values used to calculate the score:     Age: 57 years     Clincally relevant sex: Male     Is Non-Hispanic African American: Yes     Diabetic: No     Tobacco smoker: Yes     Systolic Blood Pressure: 110 mmHg     Is BP treated: No     HDL Cholesterol: 48 mg/dL     Total Cholesterol: 185 mg/dL  Objective:  Physical Exam Vitals and nursing note reviewed.  Constitutional:      General: He is not in acute distress.    Appearance: Normal appearance. He is obese.  HENT:     Head: Normocephalic and atraumatic.     Right Ear: Tympanic membrane, ear canal and external ear normal. There is no impacted cerumen.     Left Ear: Tympanic membrane, ear canal and external ear normal. There is no impacted cerumen.     Nose:     Comments: Deferred - masked    Mouth/Throat:     Comments: Deferred - masked Eyes:     Extraocular Movements: Extraocular movements intact.     Conjunctiva/sclera: Conjunctivae normal.     Pupils: Pupils are equal, round, and reactive to light.  Cardiovascular:     Rate and Rhythm: Normal rate and regular rhythm.     Pulses: Normal pulses.     Heart sounds: Normal heart sounds. No murmur heard. Pulmonary:     Effort: Pulmonary effort is normal. No  respiratory distress.     Breath sounds: Normal breath sounds. No wheezing.  Abdominal:     General: Abdomen is flat. Bowel sounds are normal. There is no distension.     Palpations: Abdomen is soft.     Tenderness: There is no abdominal tenderness.  Genitourinary:    Penis: Normal.      Testes: Normal.     Prostate:  Normal.     Rectum: Normal.  Musculoskeletal:        General: No swelling or tenderness. Normal range of motion.     Cervical back: Normal range of motion and neck supple.  Skin:    General: Skin is warm.     Capillary Refill: Capillary refill takes less than 2 seconds.  Neurological:     General: No focal deficit present.     Mental Status: He is alert and oriented to person, place, and time.     Cranial Nerves: No cranial nerve deficit.     Motor: No weakness.  Psychiatric:        Mood and Affect: Mood normal.        Behavior: Behavior normal.        Thought Content: Thought content normal.        Judgment: Judgment normal.      Assessment And Plan:    Encounter for annual health examination Assessment & Plan: Routine wellness visit with focus on exercise and dietary habits. - Encourage resumption of regular physical activity, aiming for 150 minutes per week. - Screen for hepatitis B immunity. - Offer influenza and pneumococcal vaccines. - Order comprehensive blood work including PSA level.   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 Previously lost weight through regular exercise. - Encourage resumption of regular exercise, aiming for 150 minutes per week.   Tobacco use disorder Assessment & Plan: Continues smoking half a  pack daily. Inconsistent nicotine  patch use, effective when used consistently. - Encourage consistent use of nicotine  patches.   Abnormal glucose -     CMP14+EGFR -     Hemoglobin A1c  Influenza vaccination declined  Other long term (current) drug therapy -     CBC with Differential/Platelet  OSA on CPAP  Great toe pain, right Assessment & Plan: Injury with bruising and subungual hematoma. No significant pain or impairment. - Reassure regarding potential loss of toenail.   Other hyperlipidemia  Vitamin D  deficiency Assessment & Plan: Will check vitamin D  level and supplement as needed.    Also encouraged to spend 15 minutes in the sun daily.    Orders: -     VITAMIN D  25 Hydroxy (Vit-D Deficiency, Fractures)  Rash and nonspecific skin eruption Assessment & Plan: Rash likely eczematous behind right ear. - Apply hydrocortisone cream to affected area.     Return for 1 year physical, 20M PRE DM.SABRA Patient was given opportunity to ask questions. Patient verbalized understanding of the plan and was able to repeat key elements of the plan. All questions were answered to their satisfaction.   Bruce Ada, FNP  I, Bruce Ada, FNP, have reviewed all documentation for this visit. The documentation on 12/22/23 for the exam, diagnosis, procedures, and orders are all accurate and complete.

## 2023-12-23 LAB — CBC WITH DIFFERENTIAL/PLATELET
Basophils Absolute: 0.1 x10E3/uL (ref 0.0–0.2)
Basos: 1 %
EOS (ABSOLUTE): 0.3 x10E3/uL (ref 0.0–0.4)
Eos: 4 %
Hematocrit: 42 % (ref 37.5–51.0)
Hemoglobin: 14.2 g/dL (ref 13.0–17.7)
Immature Grans (Abs): 0 x10E3/uL (ref 0.0–0.1)
Immature Granulocytes: 0 %
Lymphocytes Absolute: 3 x10E3/uL (ref 0.7–3.1)
Lymphs: 44 %
MCH: 29.5 pg (ref 26.6–33.0)
MCHC: 33.8 g/dL (ref 31.5–35.7)
MCV: 87 fL (ref 79–97)
Monocytes Absolute: 0.7 x10E3/uL (ref 0.1–0.9)
Monocytes: 10 %
Neutrophils Absolute: 2.9 x10E3/uL (ref 1.4–7.0)
Neutrophils: 41 %
Platelets: 261 x10E3/uL (ref 150–450)
RBC: 4.82 x10E6/uL (ref 4.14–5.80)
RDW: 13.9 % (ref 11.6–15.4)
WBC: 6.9 x10E3/uL (ref 3.4–10.8)

## 2023-12-23 LAB — CMP14+EGFR
ALT: 27 IU/L (ref 0–44)
AST: 16 IU/L (ref 0–40)
Albumin: 4.3 g/dL (ref 4.1–5.1)
Alkaline Phosphatase: 66 IU/L (ref 47–123)
BUN/Creatinine Ratio: 9 (ref 9–20)
BUN: 9 mg/dL (ref 6–24)
Bilirubin Total: 0.3 mg/dL (ref 0.0–1.2)
CO2: 21 mmol/L (ref 20–29)
Calcium: 9.6 mg/dL (ref 8.7–10.2)
Chloride: 103 mmol/L (ref 96–106)
Creatinine, Ser: 1.05 mg/dL (ref 0.76–1.27)
Globulin, Total: 2.5 g/dL (ref 1.5–4.5)
Glucose: 77 mg/dL (ref 70–99)
Potassium: 4.1 mmol/L (ref 3.5–5.2)
Sodium: 140 mmol/L (ref 134–144)
Total Protein: 6.8 g/dL (ref 6.0–8.5)
eGFR: 88 mL/min/1.73 (ref >59)

## 2023-12-23 LAB — LIPID PANEL
Chol/HDL Ratio: 3.9 ratio (ref 0.0–5.0)
Cholesterol, Total: 185 mg/dL (ref 100–199)
HDL: 48 mg/dL (ref >39)
LDL Chol Calc (NIH): 124 mg/dL — ABNORMAL HIGH (ref 0–99)
Triglycerides: 72 mg/dL (ref 0–149)
VLDL Cholesterol Cal: 13 mg/dL (ref 5–40)

## 2023-12-23 LAB — HEPATITIS B SURFACE ANTIBODY,QUALITATIVE: Hep B Surface Ab, Qual: REACTIVE

## 2023-12-23 LAB — VITAMIN D 25 HYDROXY (VIT D DEFICIENCY, FRACTURES): Vit D, 25-Hydroxy: 15.4 ng/mL — ABNORMAL LOW (ref 30.0–100.0)

## 2023-12-23 LAB — HEMOGLOBIN A1C
Est. average glucose Bld gHb Est-mCnc: 117 mg/dL
Hgb A1c MFr Bld: 5.7 % — ABNORMAL HIGH (ref 4.8–5.6)

## 2023-12-23 LAB — PSA: Prostate Specific Ag, Serum: 1.2 ng/mL (ref 0.0–4.0)

## 2024-01-03 ENCOUNTER — Ambulatory Visit: Payer: Self-pay | Admitting: Nurse Practitioner

## 2024-01-03 DIAGNOSIS — Z0000 Encounter for general adult medical examination without abnormal findings: Secondary | ICD-10-CM | POA: Insufficient documentation

## 2024-01-03 DIAGNOSIS — R21 Rash and other nonspecific skin eruption: Secondary | ICD-10-CM | POA: Insufficient documentation

## 2024-01-03 MED ORDER — VITAMIN D (ERGOCALCIFEROL) 1.25 MG (50000 UNIT) PO CAPS: 50000.0000 [IU] | ORAL_CAPSULE | ORAL | 1 refills | Status: AC

## 2024-01-03 NOTE — Assessment & Plan Note (Signed)
 Routine wellness visit with focus on exercise and dietary habits. - Encourage resumption of regular physical activity, aiming for 150 minutes per week. - Screen for hepatitis B immunity. - Offer influenza and pneumococcal vaccines. - Order comprehensive blood work including PSA level.

## 2024-01-03 NOTE — Assessment & Plan Note (Signed)
 Rash likely eczematous behind right ear. - Apply hydrocortisone cream to affected area.

## 2024-01-03 NOTE — Assessment & Plan Note (Signed)
 Continues smoking half a pack daily. Inconsistent nicotine  patch use, effective when used consistently. - Encourage consistent use of nicotine  patches.

## 2024-01-03 NOTE — Assessment & Plan Note (Signed)
 Will check vitamin D  level and supplement as needed.    Also encouraged to spend 15 minutes in the sun daily.

## 2024-01-03 NOTE — Assessment & Plan Note (Signed)
 Weight gain after stopping exercise. Previously lost weight through regular exercise. - Encourage resumption of regular exercise, aiming for 150 minutes per week.

## 2024-01-03 NOTE — Assessment & Plan Note (Signed)
 Injury with bruising and subungual hematoma. No significant pain or impairment. - Reassure regarding potential loss of toenail.

## 2024-06-21 ENCOUNTER — Ambulatory Visit: Payer: Self-pay | Admitting: Nurse Practitioner

## 2024-07-27 ENCOUNTER — Ambulatory Visit: Admitting: Neurology

## 2024-12-22 ENCOUNTER — Encounter: Payer: Self-pay | Admitting: Nurse Practitioner
